# Patient Record
Sex: Female | Born: 1965
Health system: Southern US, Community
[De-identification: ages and names within clinical notes are randomized; demographics above are authoritative.]

## PROBLEM LIST (undated history)

## (undated) DIAGNOSIS — R1115 Cyclical vomiting syndrome unrelated to migraine: Secondary | ICD-10-CM

## (undated) DIAGNOSIS — A630 Anogenital (venereal) warts: Secondary | ICD-10-CM

## (undated) DIAGNOSIS — E119 Type 2 diabetes mellitus without complications: Secondary | ICD-10-CM

## (undated) DIAGNOSIS — E049 Nontoxic goiter, unspecified: Secondary | ICD-10-CM

## (undated) DIAGNOSIS — J309 Allergic rhinitis, unspecified: Secondary | ICD-10-CM

## (undated) DIAGNOSIS — Z86010 Personal history of colonic polyps: Secondary | ICD-10-CM

## (undated) DIAGNOSIS — E785 Hyperlipidemia, unspecified: Secondary | ICD-10-CM

## (undated) DIAGNOSIS — I10 Essential (primary) hypertension: Secondary | ICD-10-CM

## (undated) DIAGNOSIS — D259 Leiomyoma of uterus, unspecified: Secondary | ICD-10-CM

## (undated) DIAGNOSIS — K30 Functional dyspepsia: Secondary | ICD-10-CM

## (undated) DIAGNOSIS — B009 Herpesviral infection, unspecified: Secondary | ICD-10-CM

## (undated) DIAGNOSIS — N900 Mild vulvar dysplasia: Secondary | ICD-10-CM

## (undated) DIAGNOSIS — M1990 Unspecified osteoarthritis, unspecified site: Secondary | ICD-10-CM

## (undated) DIAGNOSIS — G5600 Carpal tunnel syndrome, unspecified upper limb: Secondary | ICD-10-CM

## (undated) HISTORY — DX: Herpesviral infection, unspecified: B00.9

## (undated) HISTORY — DX: Functional dyspepsia: K30

## (undated) HISTORY — DX: Cyclical vomiting syndrome unrelated to migraine: R11.15

## (undated) HISTORY — DX: Anogenital (venereal) warts: A63.0

## (undated) HISTORY — DX: Allergic rhinitis, unspecified: J30.9

## (undated) HISTORY — PX: COLOSTOMY: SHX63

## (undated) HISTORY — DX: Unspecified osteoarthritis, unspecified site: M19.90

## (undated) HISTORY — DX: Hyperlipidemia, unspecified: E78.5

## (undated) HISTORY — DX: Carpal tunnel syndrome, unspecified upper limb: G56.00

## (undated) HISTORY — DX: Type 2 diabetes mellitus without complications: E11.9

## (undated) HISTORY — DX: Mild vulvar dysplasia: N90.0

## (undated) HISTORY — DX: Personal history of colonic polyps: Z86.010

## (undated) HISTORY — DX: Essential (primary) hypertension: I10

## (undated) HISTORY — DX: Nontoxic goiter, unspecified: E04.9

## (undated) HISTORY — PX: CYSTOSCOPY TUMOR / CONDYLOMATA W/ LASER: SUR373

## (undated) HISTORY — DX: Leiomyoma of uterus, unspecified: D25.9

---

## 2000-02-08 ENCOUNTER — Other Ambulatory Visit: Admission: RE | Admit: 2000-02-08 | Discharge: 2000-02-08 | Payer: Self-pay | Admitting: Obstetrics & Gynecology

## 2001-02-11 ENCOUNTER — Other Ambulatory Visit: Admission: RE | Admit: 2001-02-11 | Discharge: 2001-02-11 | Payer: Self-pay | Admitting: Obstetrics & Gynecology

## 2002-12-28 ENCOUNTER — Ambulatory Visit (HOSPITAL_COMMUNITY): Admission: RE | Admit: 2002-12-28 | Discharge: 2002-12-28 | Payer: Self-pay | Admitting: Obstetrics and Gynecology

## 2002-12-28 ENCOUNTER — Encounter: Payer: Self-pay | Admitting: Obstetrics and Gynecology

## 2003-12-13 ENCOUNTER — Other Ambulatory Visit: Admission: RE | Admit: 2003-12-13 | Discharge: 2003-12-13 | Payer: Self-pay | Admitting: Obstetrics and Gynecology

## 2005-04-18 ENCOUNTER — Other Ambulatory Visit: Admission: RE | Admit: 2005-04-18 | Discharge: 2005-04-18 | Payer: Self-pay | Admitting: Obstetrics and Gynecology

## 2006-03-27 ENCOUNTER — Ambulatory Visit (HOSPITAL_COMMUNITY): Admission: RE | Admit: 2006-03-27 | Discharge: 2006-03-27 | Payer: Self-pay | Admitting: Obstetrics and Gynecology

## 2006-08-06 ENCOUNTER — Emergency Department (HOSPITAL_COMMUNITY): Admission: EM | Admit: 2006-08-06 | Discharge: 2006-08-06 | Payer: Self-pay | Admitting: Family Medicine

## 2006-11-07 ENCOUNTER — Other Ambulatory Visit: Admission: RE | Admit: 2006-11-07 | Discharge: 2006-11-07 | Payer: Self-pay | Admitting: Obstetrics and Gynecology

## 2007-03-10 ENCOUNTER — Other Ambulatory Visit: Admission: RE | Admit: 2007-03-10 | Discharge: 2007-03-10 | Payer: Self-pay | Admitting: Obstetrics and Gynecology

## 2007-08-07 ENCOUNTER — Encounter: Admission: RE | Admit: 2007-08-07 | Discharge: 2007-08-07 | Payer: Self-pay | Admitting: Internal Medicine

## 2007-08-07 ENCOUNTER — Encounter: Payer: Self-pay | Admitting: Internal Medicine

## 2007-10-30 HISTORY — PX: UPPER GASTROINTESTINAL ENDOSCOPY: SHX188

## 2007-12-24 ENCOUNTER — Other Ambulatory Visit: Admission: RE | Admit: 2007-12-24 | Discharge: 2007-12-24 | Payer: Self-pay | Admitting: Obstetrics and Gynecology

## 2008-01-07 ENCOUNTER — Encounter: Admission: RE | Admit: 2008-01-07 | Discharge: 2008-01-07 | Payer: Self-pay | Admitting: Obstetrics and Gynecology

## 2008-01-13 ENCOUNTER — Ambulatory Visit: Payer: Self-pay | Admitting: Internal Medicine

## 2008-01-16 ENCOUNTER — Ambulatory Visit: Payer: Self-pay | Admitting: Internal Medicine

## 2008-01-22 ENCOUNTER — Ambulatory Visit (HOSPITAL_COMMUNITY): Admission: RE | Admit: 2008-01-22 | Discharge: 2008-01-22 | Payer: Self-pay | Admitting: Internal Medicine

## 2008-02-04 ENCOUNTER — Ambulatory Visit (HOSPITAL_COMMUNITY): Admission: RE | Admit: 2008-02-04 | Discharge: 2008-02-04 | Payer: Self-pay | Admitting: Internal Medicine

## 2008-07-29 DIAGNOSIS — R1115 Cyclical vomiting syndrome unrelated to migraine: Secondary | ICD-10-CM

## 2008-07-29 HISTORY — DX: Cyclical vomiting syndrome unrelated to migraine: R11.15

## 2008-08-11 ENCOUNTER — Other Ambulatory Visit: Admission: RE | Admit: 2008-08-11 | Discharge: 2008-08-11 | Payer: Self-pay | Admitting: Obstetrics and Gynecology

## 2008-10-23 IMAGING — US US ABDOMEN COMPLETE
1 series · 14 of 25 positions shown · non-contrast
Comparison: none

CLINICAL DATA: Nausea.  
 COMPLETE ABDOMINAL ULTRASOUND:
TECHNIQUE: Complete abdominal ultrasound examination was performed including evaluation of the liver, gallbladder, bile ducts, pancreas, kidneys, spleen, IVC, and abdominal aorta.

[Series 1: unknown · 0.33mm/px · 14 of 81 slices shown]
[im 1/81]
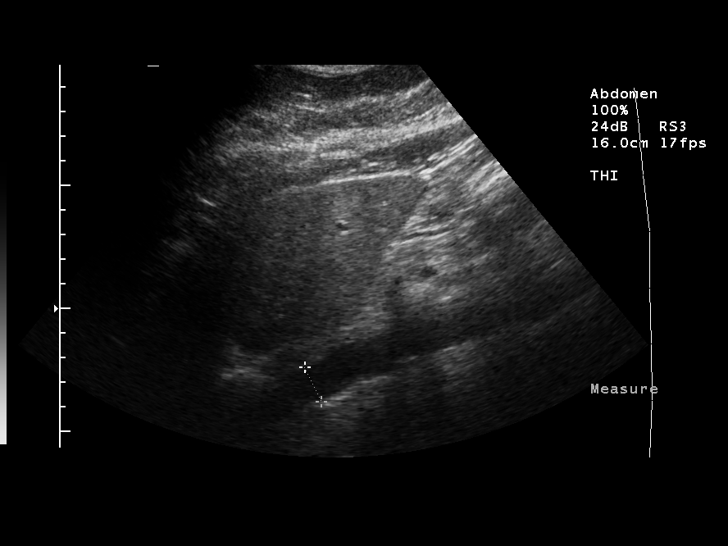
[im 7/81]
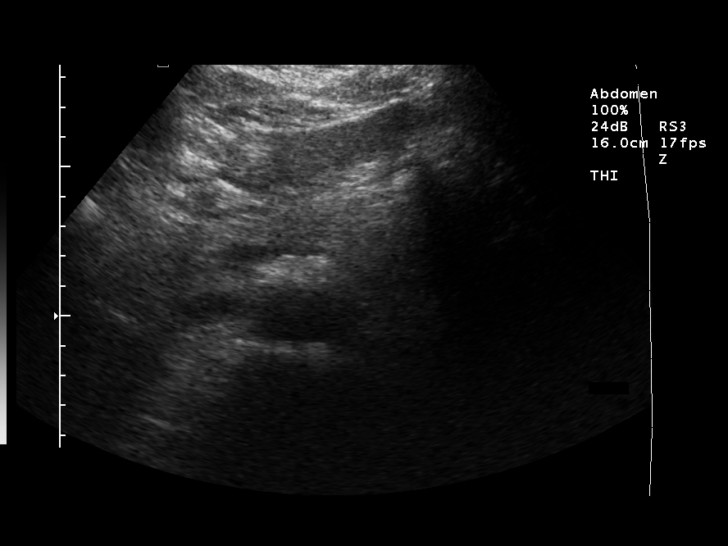
[im 14/81]
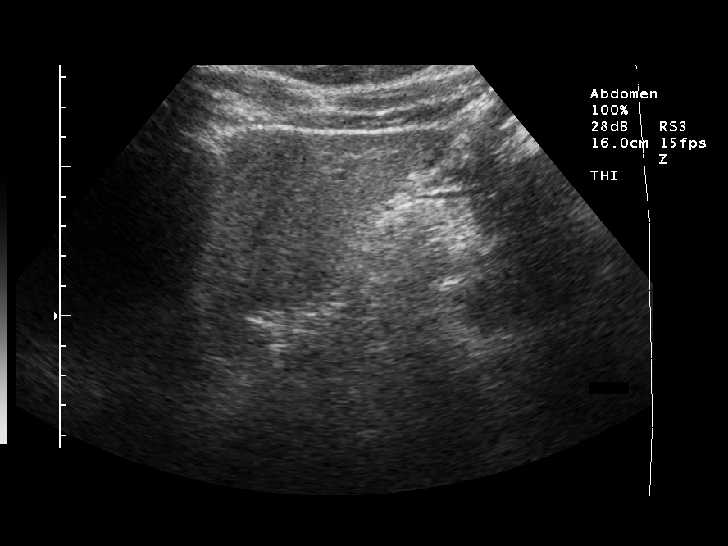
[im 21/81]
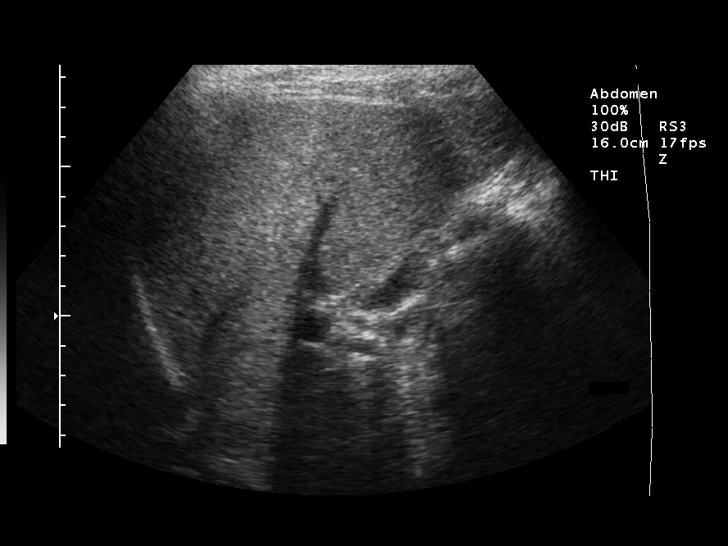
[im 27/81]
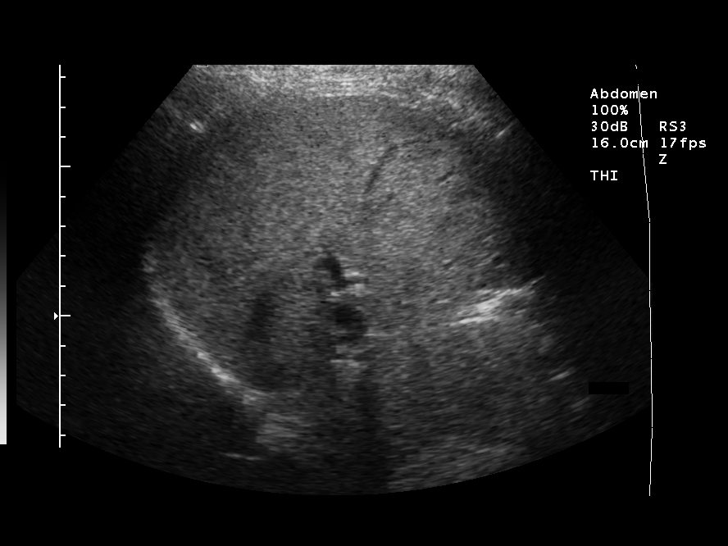
[im 31/81]
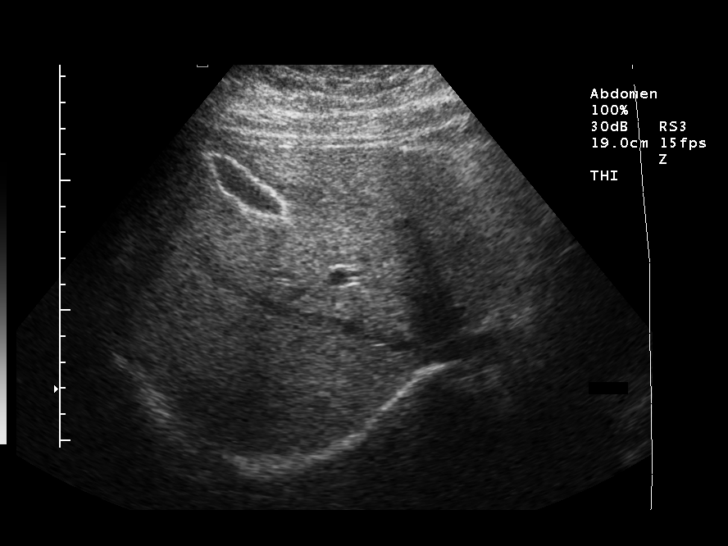
[im 37/81]
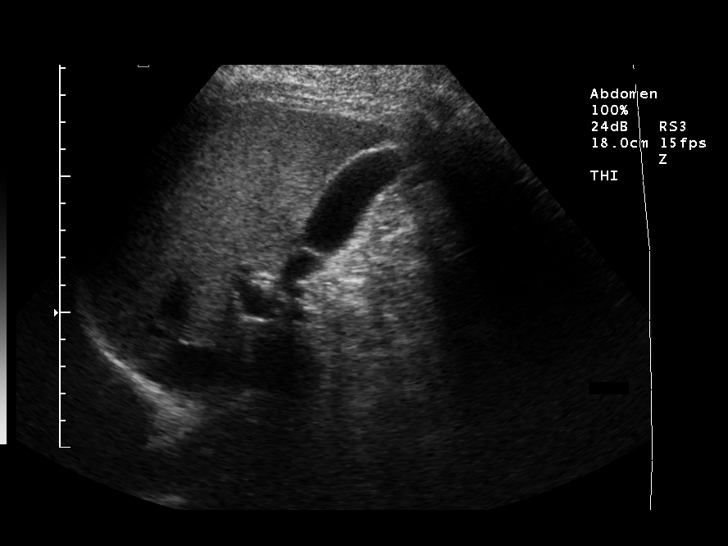
[im 44/81]
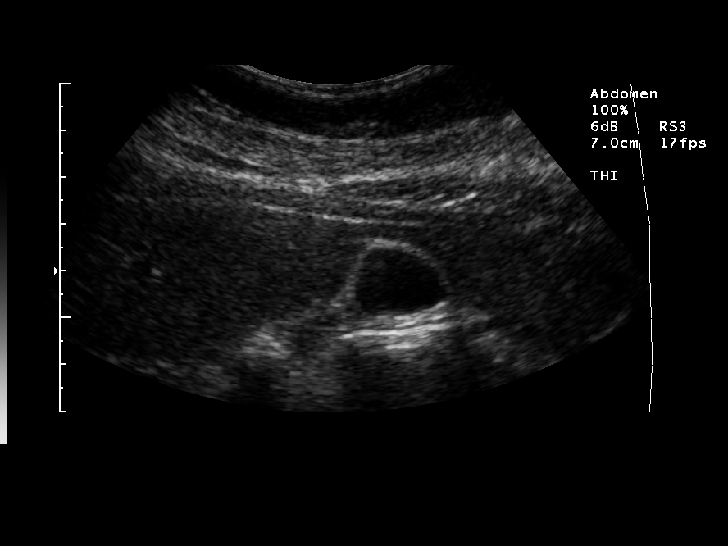
[im 51/81]
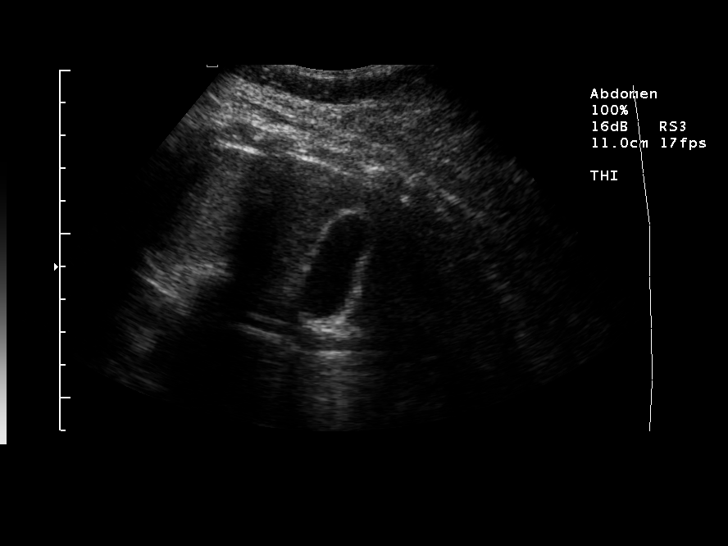
[im 54/81]
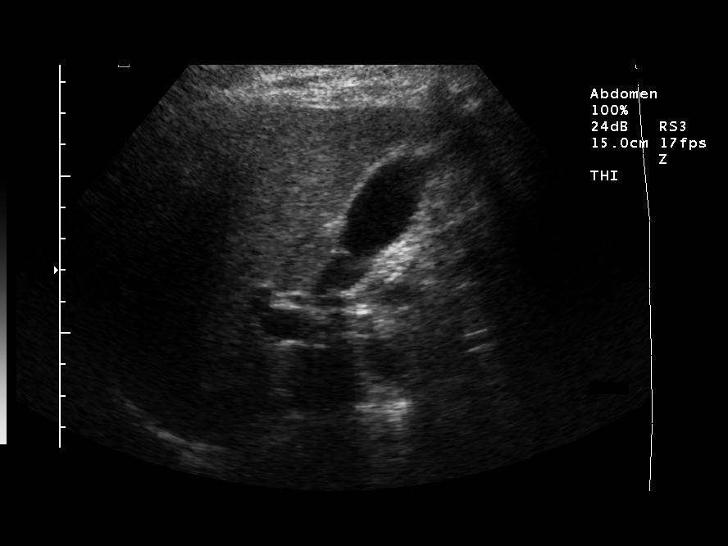
[im 61/81]
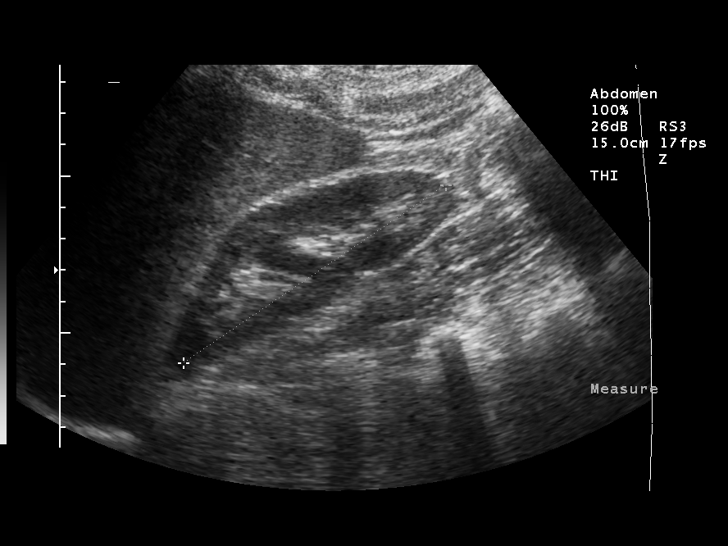
[im 67/81]
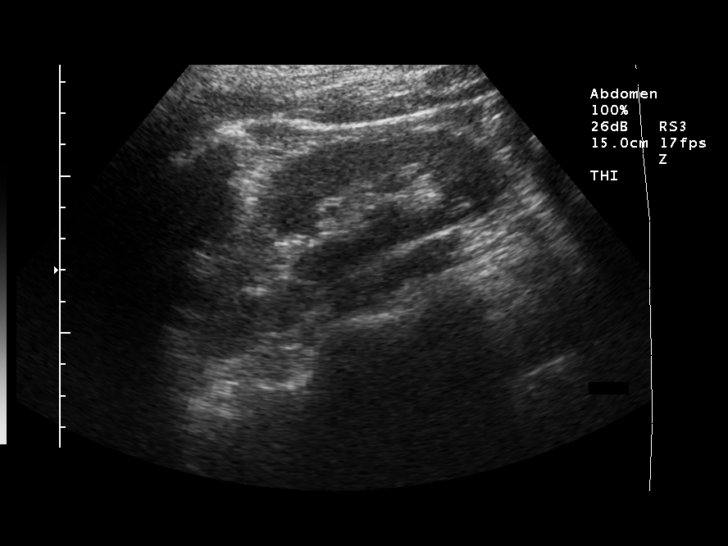
[im 74/81]
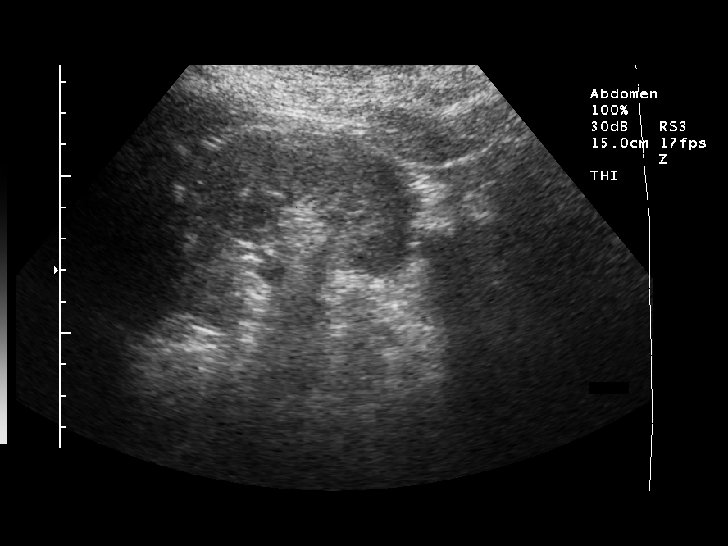
[im 81/81]
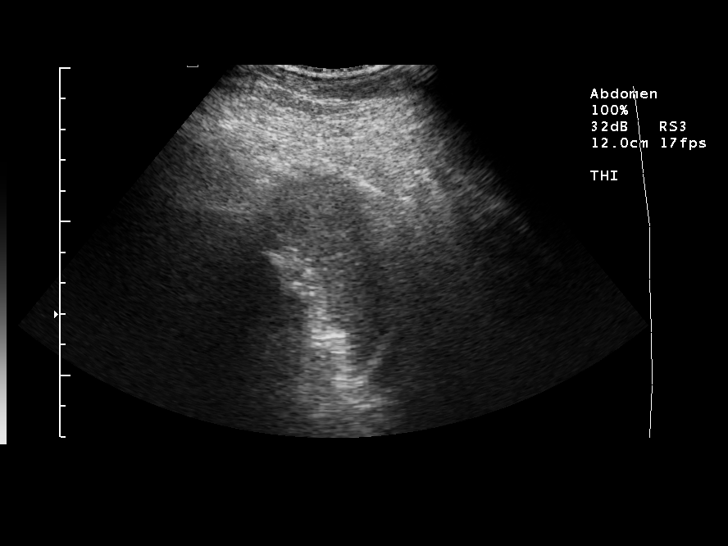

[14 of 25 positions shown; findings below may reference images not displayed]

FINDINGS: There is no evidence of gallstones or biliary ductal dilatation.  The liver is within normal limits in echogenicity, and no focal liver lesions are seen.  The visualized portions of the IVC and pancreas are unremarkable.
 There is no evidence of splenomegaly.  The kidneys are unremarkable, and there is no evidence of hydronephrosis.  The abdominal aorta is non-dilated.  Right renal length 10.1cm and left renal length 9.8cm.
IMPRESSION: Negative abdominal ultrasound.

## 2008-10-29 DIAGNOSIS — K30 Functional dyspepsia: Secondary | ICD-10-CM

## 2008-10-29 HISTORY — DX: Functional dyspepsia: K30

## 2008-10-29 HISTORY — PX: EUS: SHX5427

## 2009-01-14 ENCOUNTER — Telehealth: Payer: Self-pay | Admitting: Internal Medicine

## 2009-01-14 ENCOUNTER — Ambulatory Visit: Payer: Self-pay | Admitting: Internal Medicine

## 2009-01-19 ENCOUNTER — Ambulatory Visit (HOSPITAL_COMMUNITY): Admission: RE | Admit: 2009-01-19 | Discharge: 2009-01-19 | Payer: Self-pay | Admitting: Internal Medicine

## 2009-01-20 ENCOUNTER — Encounter: Payer: Self-pay | Admitting: Gastroenterology

## 2009-02-01 ENCOUNTER — Other Ambulatory Visit: Admission: RE | Admit: 2009-02-01 | Discharge: 2009-02-01 | Payer: Self-pay | Admitting: Obstetrics and Gynecology

## 2009-02-10 ENCOUNTER — Ambulatory Visit (HOSPITAL_COMMUNITY): Admission: RE | Admit: 2009-02-10 | Discharge: 2009-02-10 | Payer: Self-pay | Admitting: Gastroenterology

## 2009-02-10 ENCOUNTER — Ambulatory Visit: Payer: Self-pay | Admitting: Gastroenterology

## 2009-03-25 IMAGING — US US SOFT TISSUE HEAD/NECK
1 series · 14 of 25 positions shown · non-contrast
Comparison: None.

CLINICAL DATA: Thyromegaly by physical exam.
 THYROID ULTRASOUND:
TECHNIQUE: Ultrasound examination of the thyroid gland and adjacent soft tissue structures was performed.

[Series 1: unknown · 0.10mm/px · 14 of 38 slices shown]
[im 1/38]
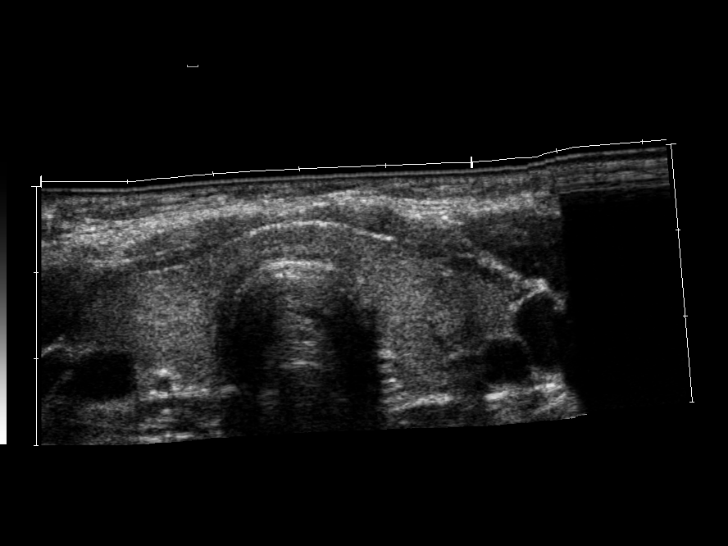
[im 4/38]
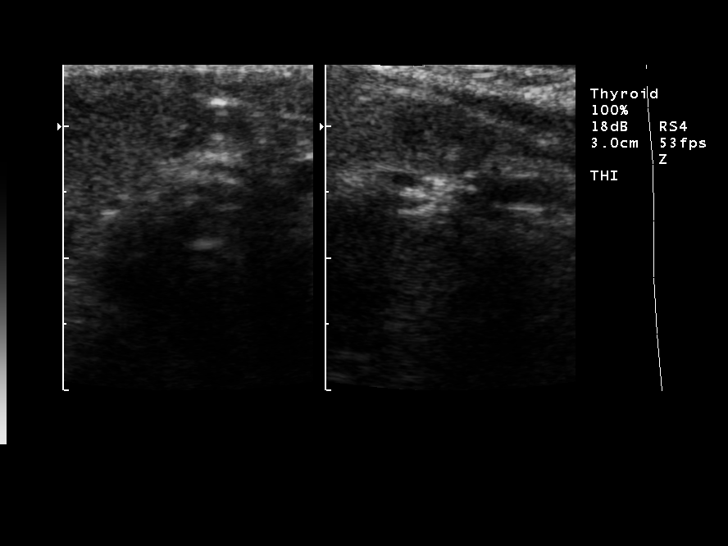
[im 7/38]
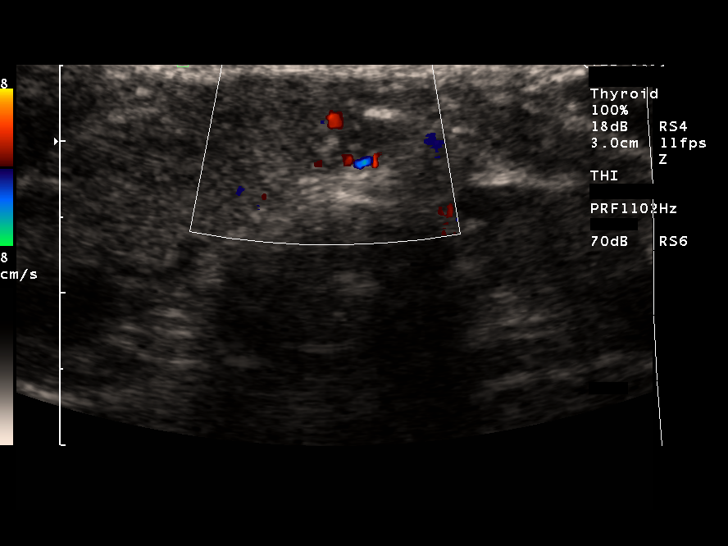
[im 10/38]
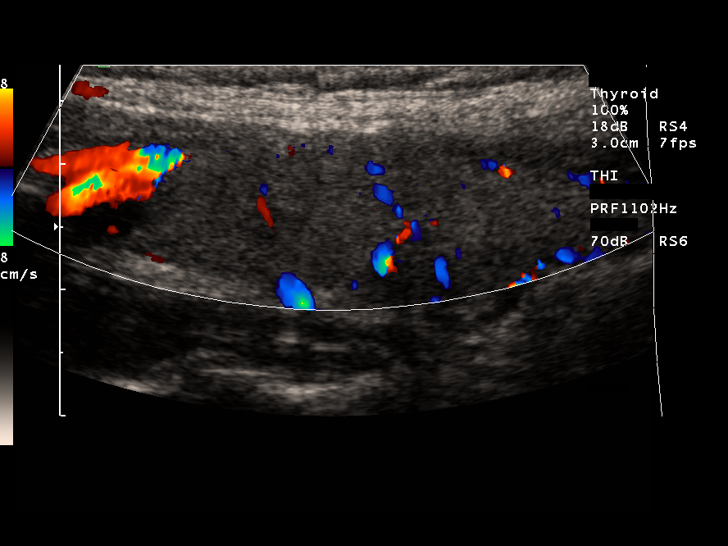
[im 13/38]
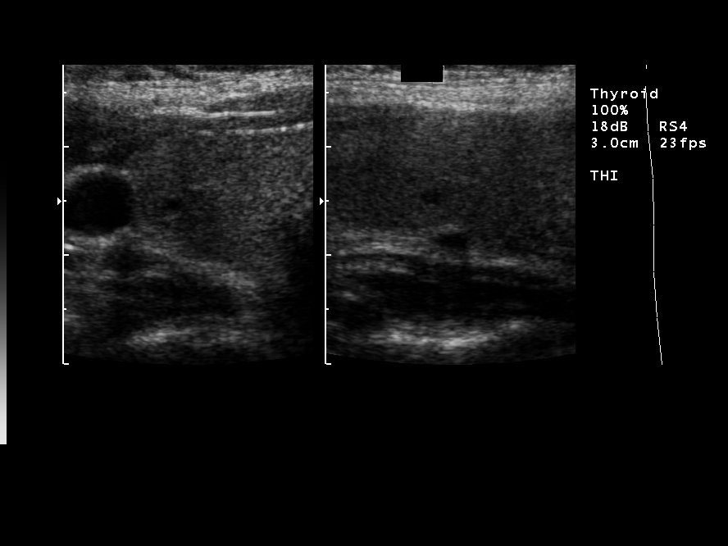
[im 14/38]
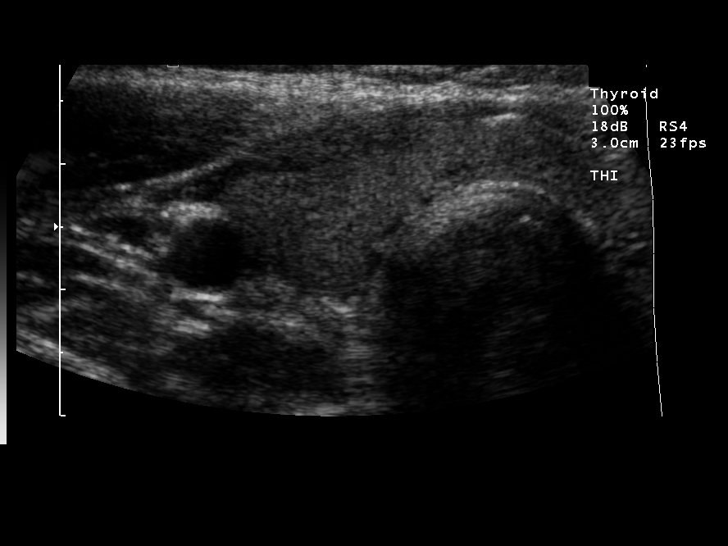
[im 17/38]
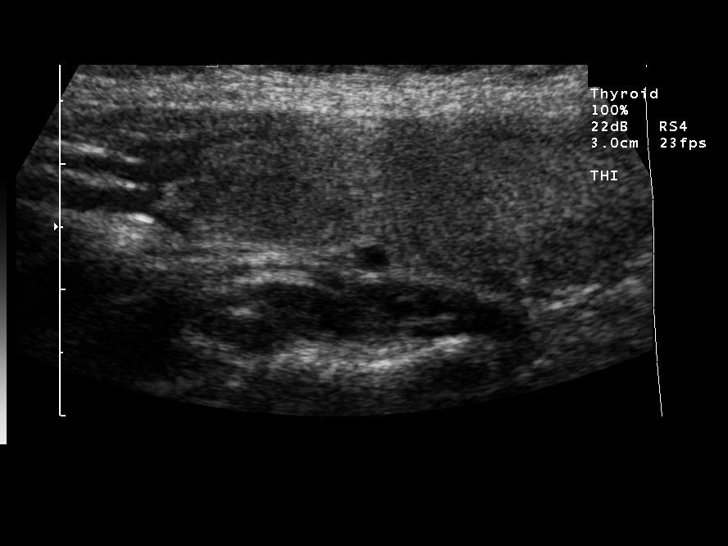
[im 21/38]
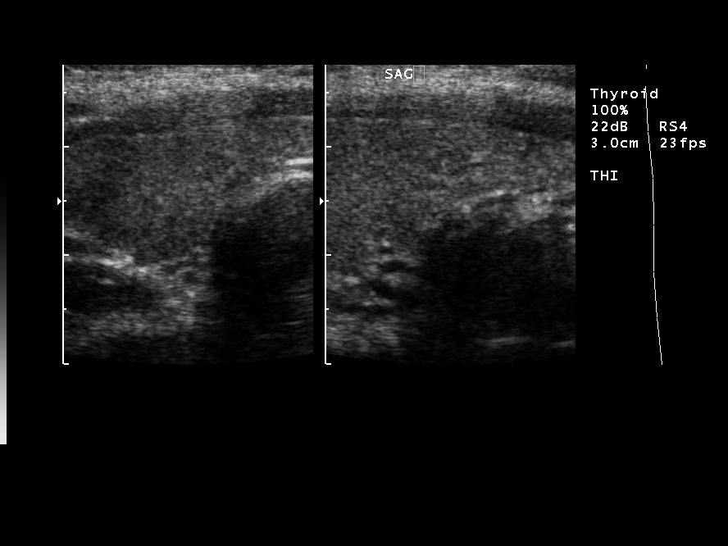
[im 24/38]
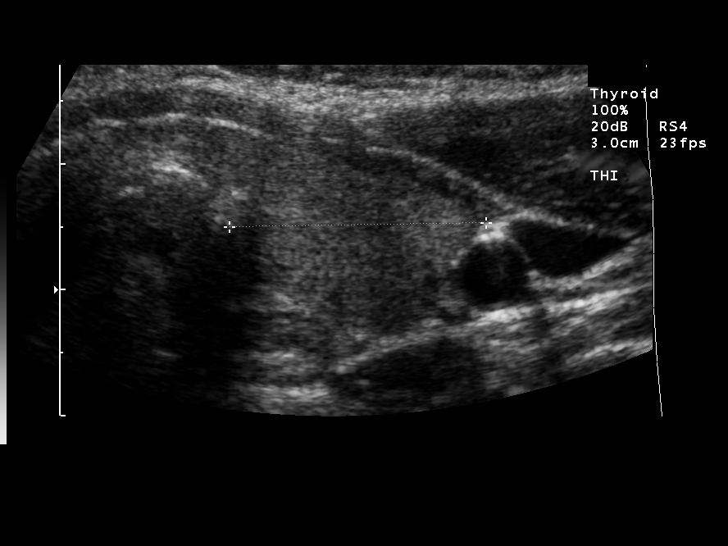
[im 25/38]
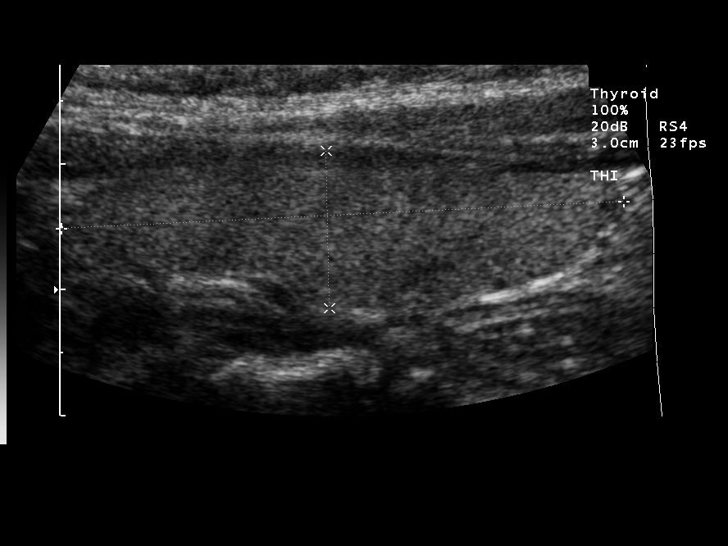
[im 28/38]
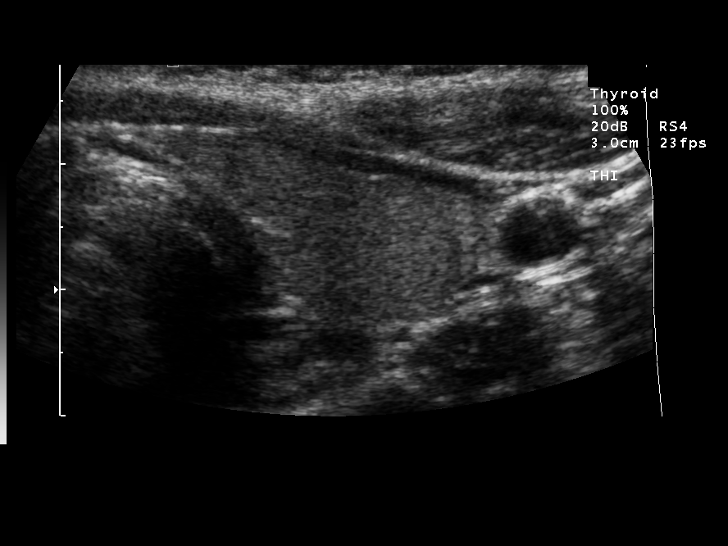
[im 31/38]
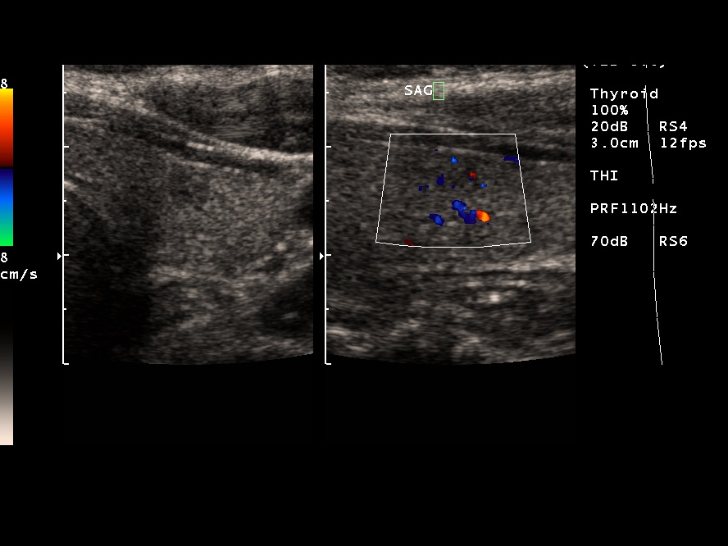
[im 34/38]
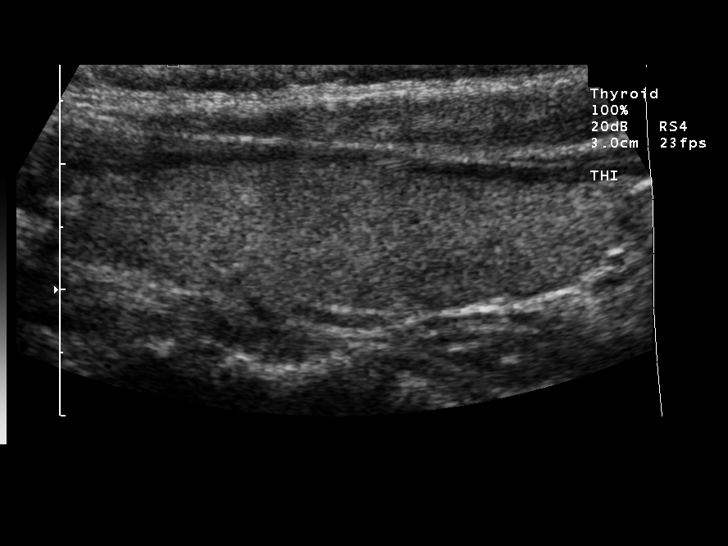
[im 38/38]
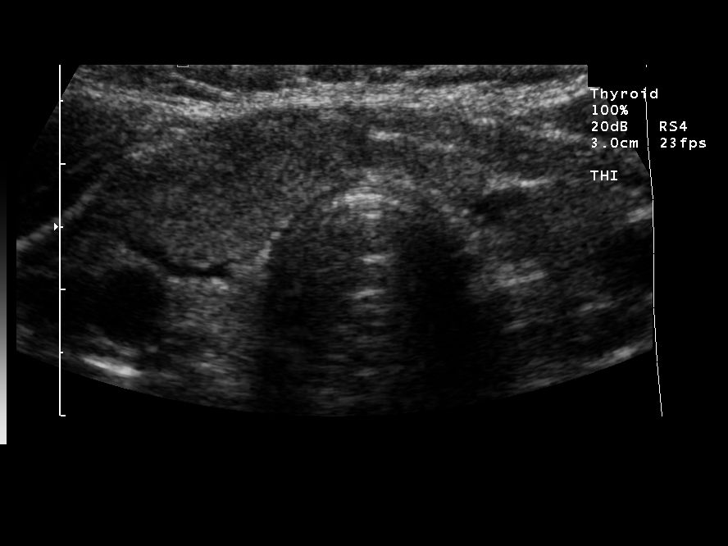

[14 of 25 positions shown; findings below may reference images not displayed]

FINDINGS: The right lobe of the thyroid gland measures 4.6 x 1.3 x 1.7 cm.  The left lobe measures 4.5 x 1.3 x 2.0 cm.  Isthmus measures 4.8 mm.  The thyroid echotexture is homogeneous with multiple bilateral nodules.  Within the mid pole region of the left lobe, there is a solid nodule measuring approximately 5 mm.   There are two solid nodules within the midpole of the right lobe of the thyroid gland.  The largest and more medial of these measures 8.5 mm.  Within the periphery of the midpole of the right kidney, there is a hypoechoic nodule measuring 3.6 x 1.8 x 2.4 mm.
IMPRESSION: 1.  Multiple small bilateral thyroid nodules.  None of these have features specific for malignancy and follow-up examination in one year is suggested.

## 2009-04-04 ENCOUNTER — Ambulatory Visit: Payer: Self-pay | Admitting: Internal Medicine

## 2009-04-06 ENCOUNTER — Encounter: Admission: RE | Admit: 2009-04-06 | Discharge: 2009-04-06 | Payer: Self-pay | Admitting: Endocrinology

## 2009-08-03 ENCOUNTER — Other Ambulatory Visit: Admission: RE | Admit: 2009-08-03 | Discharge: 2009-08-03 | Payer: Self-pay | Admitting: Obstetrics and Gynecology

## 2010-02-01 ENCOUNTER — Other Ambulatory Visit: Admission: RE | Admit: 2010-02-01 | Discharge: 2010-02-01 | Payer: Self-pay | Admitting: Obstetrics and Gynecology

## 2010-04-07 IMAGING — US US ABDOMEN COMPLETE
1 series · 14 of 25 positions shown · non-contrast
Comparison: Nuclear medicine biliary study of 02/04/2008.
Ultrasound 08/07/2007.

CLINICAL DATA: Evaluate epigastric pain.  Nausea and vomiting.

COMPLETE ABDOMINAL ULTRASOUND

[Series 1: us abdomen complete · 0.30mm/px · 14 of 75 slices shown]
[im 1/75]
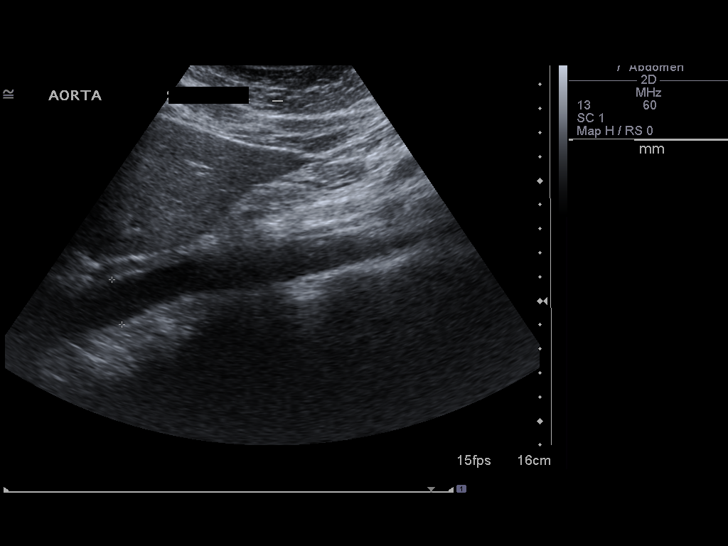
[im 7/75]
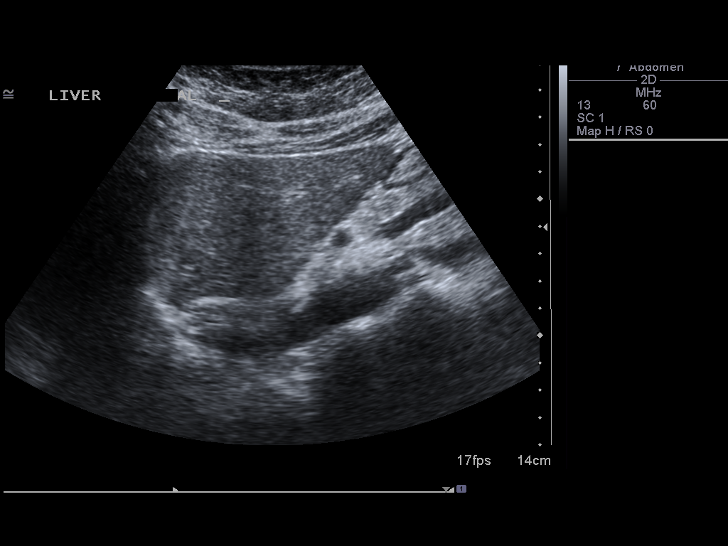
[im 13/75]
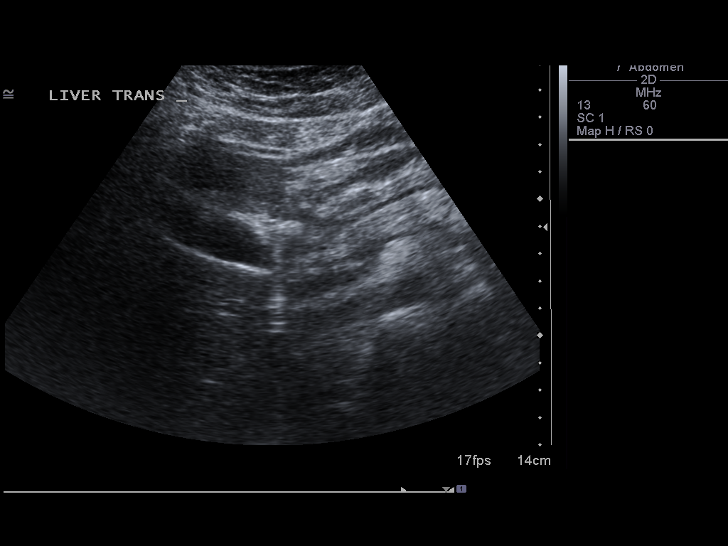
[im 19/75]
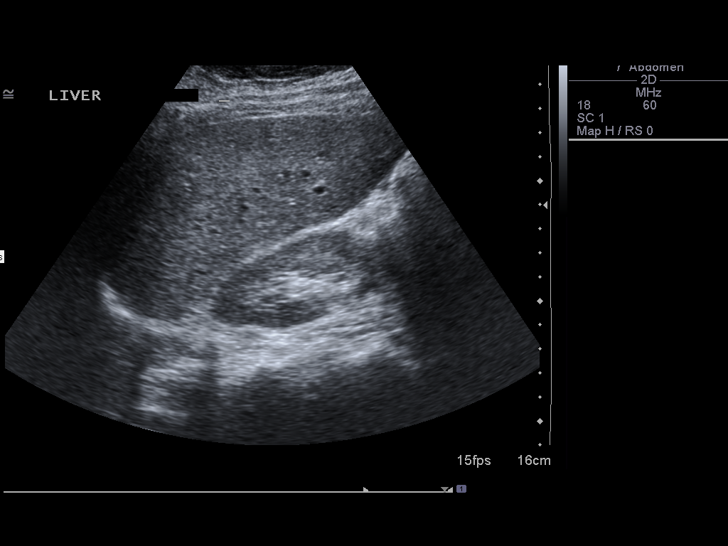
[im 25/75]
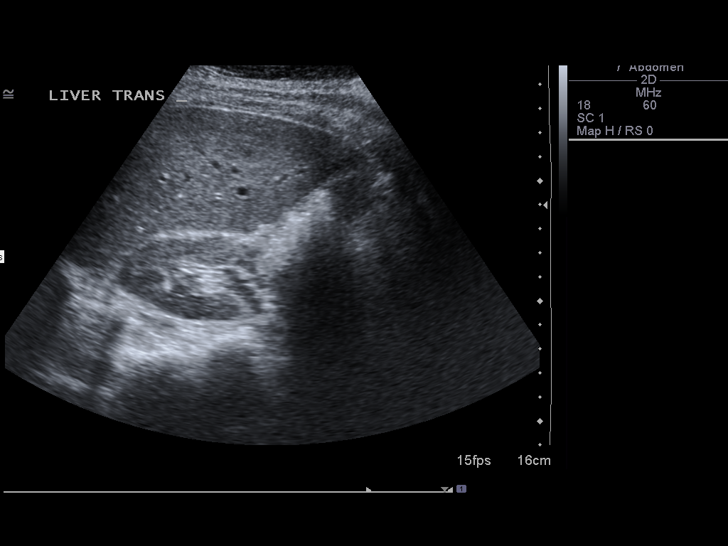
[im 28/75]
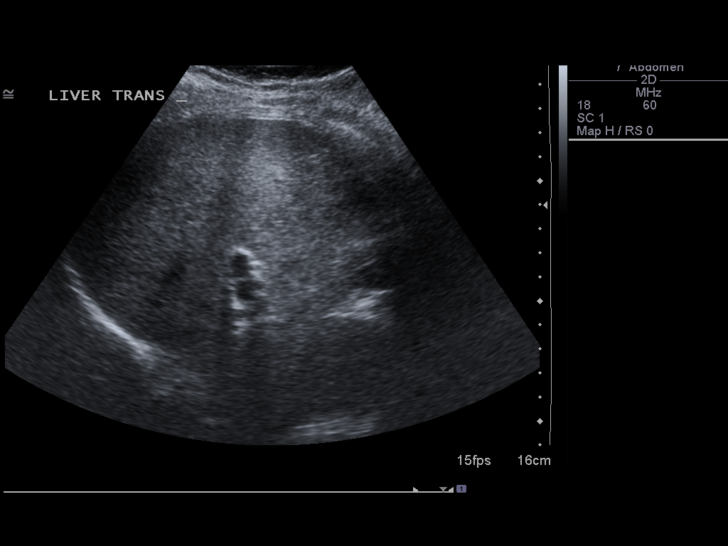
[im 34/75]
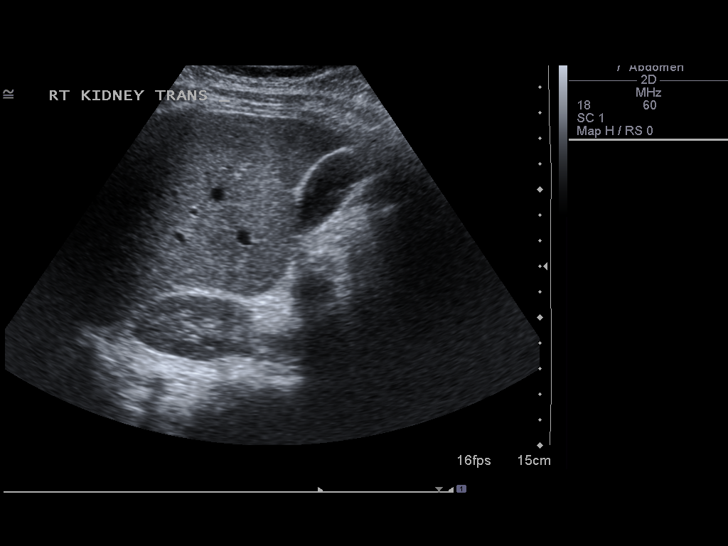
[im 41/75]
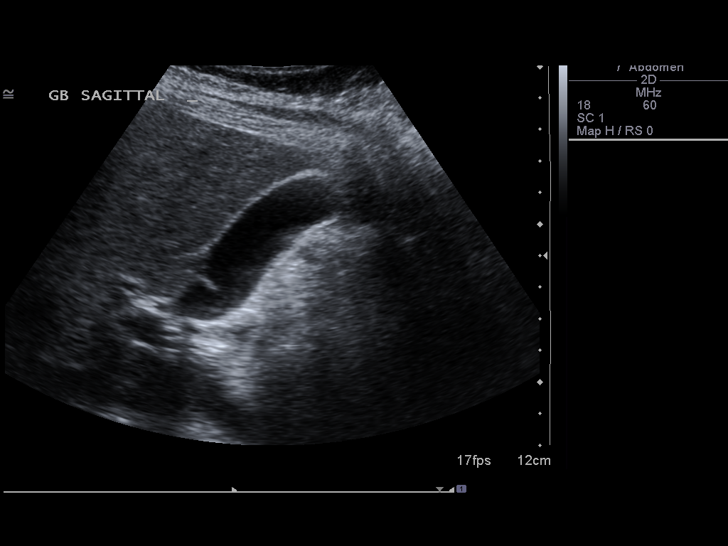
[im 47/75]
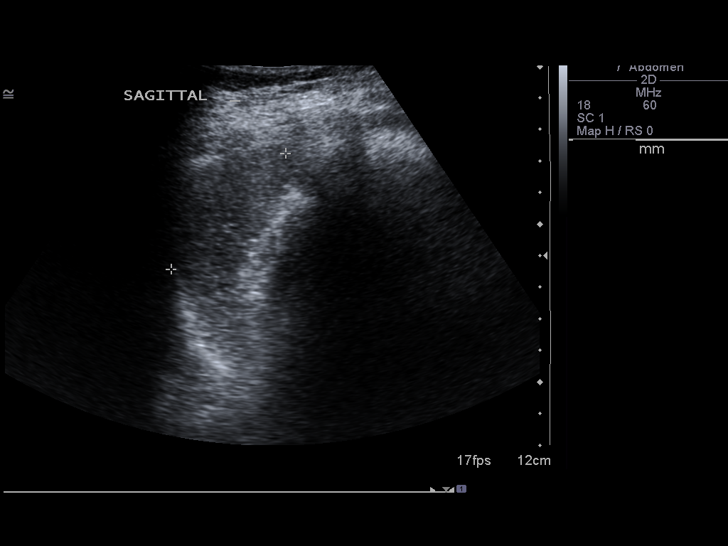
[im 50/75]
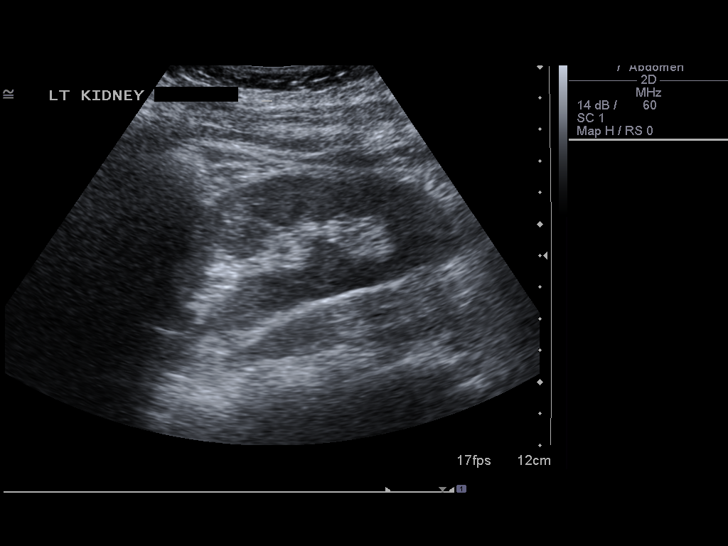
[im 56/75]
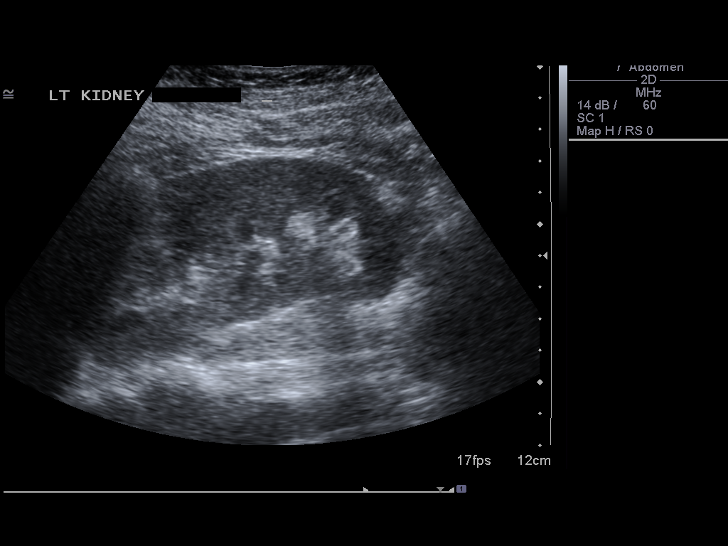
[im 62/75]
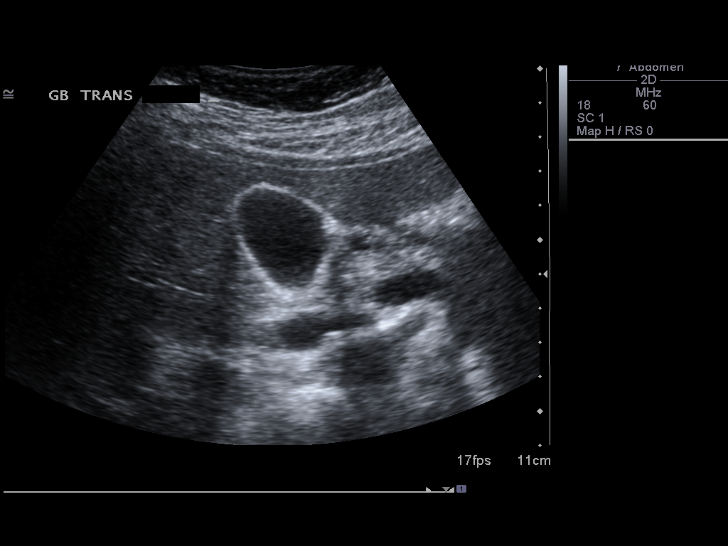
[im 68/75]
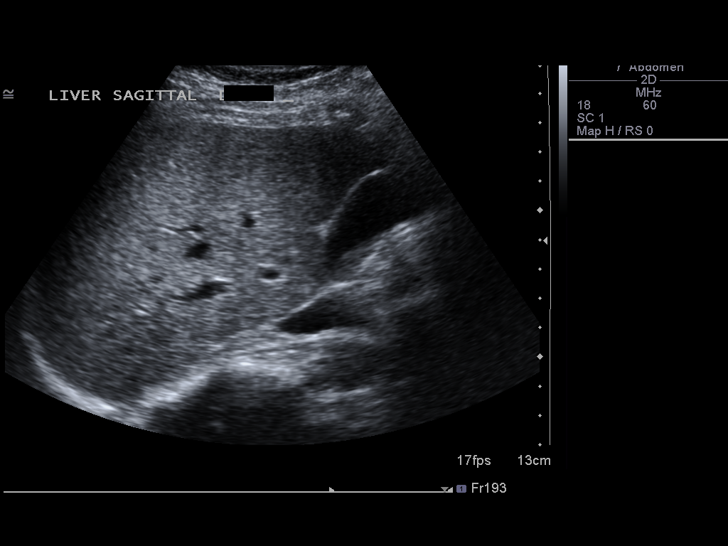
[im 75/75]
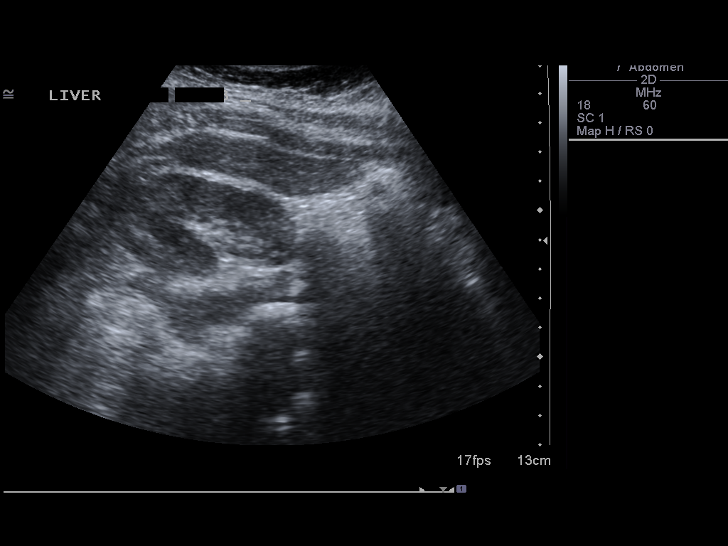

[14 of 25 positions shown; findings below may reference images not displayed]

FINDINGS: Gallbladder:  Normal, without wall thickening, stone, or
pericholecystic fluid.  Sonographic Murphy's sign was not elicited.

Common bile duct:  Normal, at 3 mm.

Liver:  Areas of mild heterogeneous echogenicity including on
images 18 and 21.  This may be artifactual or represent areas of
focal fat deposition.

IVC:  Within normal limits.

Pancreas:  Within normal limits.

Spleen:  Normal in size and echogenecity.

Right Kidney:  10.6 cm

Left Kidney:  10.3 cm.  No hydronephrosis.

Abdominal aorta:  Nonaneurysmal without ascites.
IMPRESSION: 1.  No acute process or explanation for abdominal pain.
2.  Question mild heterogeneous fatty infiltration of the liver
versus artifactual increased echogenicity.

## 2010-08-03 ENCOUNTER — Other Ambulatory Visit: Admission: RE | Admit: 2010-08-03 | Discharge: 2010-08-03 | Payer: Self-pay | Admitting: Obstetrics and Gynecology

## 2010-10-29 DIAGNOSIS — N900 Mild vulvar dysplasia: Secondary | ICD-10-CM

## 2010-10-29 HISTORY — DX: Mild vulvar dysplasia: N90.0

## 2011-03-13 NOTE — Assessment & Plan Note (Signed)
[facility]                         GASTROENTEROLOGY OFFICE NOTE   NAME:[name], [name]                        MRN:          [id number]  DATE:01/13/2008                            DOB:          1966/05/24    CHIEF COMPLAINT:  Vomiting.   REQUESTING PHYSICIAN:  Gwen Pounds, MD   ASSESSMENT:  A 45 year old African American woman that has had a  two to three year history of intermittent vomiting, mainly nocturnal.  precipitated by epigastric pain.  She is somewhat better, but not  relieved on Prilosec.  The symptoms and problems seem to have started  after what was thought to be a gastroenteritis several years ago.  That  does raise the possibility of viral inducted gastroparesis.  Gastroesophageal reflux disease and peptic ulcer disease are also in the  differential.   PLAN:  Schedule upper GI endoscopy to investigate.  Further plans  pending that.  Risks, benefits and indications are explained.  She  understands and agrees to proceed.  She has already had an abdominal  ultrasound that was negative in October 2008.   HISTORY:  As above, this 41-uear-old African American woman relates a  history of problems with vomiting.  She generally gets a relatively  intense epigastric pain and then has nausea and vomiting often at night.  She tends to roll over in the bed and have this.  She specifically  indicates that she has forceful vomiting and not regurgitation.  There  is some intermittent heartburn.  In January or February she self -  started Prilosec OTC and does feel better, but is not completely  relieved.  Bowel habits are a little bit on the constipated side, she  said, but that is no significant change and she generally moves her  bowels well without any bleeding or problems.  She has lost 7 pounds  over a period of time that is not quite known to me.  Overall no major  weight loss reported however.  I think that weight loss was over the  past few months.   CURRENT MEDICATIONS:  1. Prilosec OTC daily.  2. Simvastatin 20 mg daily.  3. Acyclovir 400 mg daily.  4. Lisinopril 40 mg daily.  5. Microgestin 1/20 daily.  6. Aspirin 81 mg daily.  7. Fish Oil 1000 mg daily.  8. Calcium with vitamin D 600 mg daily.  9. Multivitamin daily.  10.B100 Complex daily.   ALLERGIES:  TETRACYCLINE CAUSES HIVES.   PAST MEDICAL HISTORY:  1. Dyslipidemia.  2. Hypertension.  3. Recent thyroid ultrasound, unclear reasons.  Question nodule.  4. Allergic rhinitis.  5. Previous heart rhythm disturbance reported.  6. Genital warts.  7. HSV.   No surgeries.   FAMILY HISTORY:  Father had heart disease, history of alcoholism.  Parents have diabetes.  The patient herself has been borderline.   SOCIAL HISTORY:  She is single.  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  She does not use tobacco, alcohol or drugs.  She has minimal, if  any, caffeine use.   REVIEW OF SYSTEMS:  Positive for some allergies, insomnia.  She does  have sinus headache problems, but these do not seem to be correlated  significantly with her abdominal symptoms.  Sore throat, urinary  incontinence, visual changes and night sweats are reported as well.  All  other systems are negative.   PHYSICAL EXAMINATION:  Reveals a well developed, well nourished middle-  aged black woman.  Height 5 feet, weight 143 pounds.  Blood pressure  122/70, pulse 88.  EYES:  Anicteric.  Pupils round and reactive to light.  MOUTH:  Posterior pharynx free of lesions.  Dentition in good repair.  NECK:  Supple without thyromegaly or mass.  CHEST:  Clear to my exam.  HEART:  S1, S2.  No murmurs, rubs or gallops.  ABDOMEN:  Soft, nontender.  No succussion splash.  Bowel sounds are  present.  There is no organomegaly or mass.  LOWER EXTREMITIES:  Free of edema.  SKIN:  Warm and dry.  No rash.  No groin, supraclavicular or cervical adenopathy.  She is alert and oriented x3.  Cranial  nerves II-XII intact.  Mood and  affect are appropriate.   I appreciate the opportunity to care for this patient.     Iva Boop, MD,FACG  Electronically Signed    CEG/MedQ  DD: 01/13/2008  DT: 01/13/2008  Job #: [id number]

## 2011-03-16 NOTE — H&P (Signed)
[name] [name], [name] [name]               ACCOUNT NO.:  [id number]   MEDICAL RECORD NO.:  [id number]          PATIENT TYPE:  AMB   LOCATION:  [facility]                           FACILITY:  [facility]   PHYSICIAN:  Artist Pais, M.D.    DATE OF BIRTH:  1965-12-23   DATE OF ADMISSION:  DATE OF DISCHARGE:                                HISTORY & PHYSICAL   HISTORY OF PRESENT ILLNESS:  The patient is a 45 year old, gravida 0,  African-American female who presented in February for her annual GYN  examination. At that time, she was found to have multiple frond-like areas  all over her vulva and in the perianal region. The working diagnosis was  that of condylomata, and in fact, she returned for biopsies which showed  condylomata acuminatum. On followup evaluation, she was found to have a new  condyloma at the left labia. This was biopsied and found to be associated  with a low-grade vulvar intra-epithelial neoplasia. The patient was advised  to undergo laser treatment because of the very confluent nature of these  condylomata. She was advised as to other treatment for condylomata, but I  did explain to her that it would be difficult, in fact, to remove these via  some type of therapy such as podophyllin, TCA or Aldara. She was made aware  that my preference would be to do laser surgery. Risks of laser including  anesthetic complication, hemorrhage, infection, damage to adjacent  structures including bladder, bowel, blood vessels, ureters were discussed  with the patient, and she was made aware of the risk of retinal damage as  well. She expressed understanding of and acceptance of these risks and  desires to proceed with surgery. She was checked for other sexually  transmitted diseases, and GC, chlamydia, HIV and RPR were negative. Also  explained to her that given that these were so confluent it was likely that  her boyfriend has other sexual partners, and she was urged to discuss this  with him. I also did  a wet prep looking for Trichomonas and this was  negative as well.   OBSTETRICAL/GYNECOLOGICAL HISTORY:  The patient uses Microgestin for  contraception. Her cycles are every 28 days with a 3- to 5-day duration of  flow. She does not have a history of any other sexually transmitted disease  with the exception of these condylomata. No history of PID. No history of  abnormal Pap.   PAST MEDICAL HISTORY:  1.  Hypertension.  2.  Fibroid uterus and menorrhagia, well controlled with Microgestin oral      contraceptive.   ALLERGIES:  TETRACYCLINE causes hives.   CURRENT MEDICATIONS:  1.  Tarka 180 mg daily.  2.  Microgestin for contraception.   PAST SURGICAL HISTORY:  None.   FAMILY HISTORY:  There is no family history of colon, breast, ovarian, or  prostate cancer. Her father is 89 with hypertension and heart disease. Her  mother is 38 with hypertension and diabetes. Her brother is 23 with  hypertension. She does not have any children.   SOCIAL HISTORY:  The patient does not smoke.  She also does not drink. She  works for [employer] in housekeeping.   REVIEW OF SYSTEMS:  Noncontributory except as noted above. Denies headaches,  visual changes, chest pain, shortness of breath, abdominal pain, change in  bowel habits, unintentional weight loss, dysuria, urgency, frequency,  vaginal pruritus or discharge, pain or burning with intercourse.   PHYSICAL EXAMINATION:  GENERAL APPEARANCE:  Well-developed, African-American  female.  VITAL SIGNS:  Blood pressure 150/84, heart rate 104, weight 147.  HEENT:  Normal.  NECK:  Supple without thyromegaly, adenopathy or nodules.  CHEST:  Clear to auscultation.  BREASTS:  Symmetric without masses. No nipple retraction or nipple  discharge.  CARDIAC:  Regular rate and rhythm without extra sounds or murmurs.  ABDOMEN:  Soft and nontender. No hepatosplenomegaly or masses.  EXTREMITIES:  No clubbing, cyanosis, or edema.  NEUROLOGICAL:   Oriented x3. Grossly normal.  PELVIC:  Normal external female genitalia. The patient has confluent  condylomata all over her perineal and perianal regions. She also has  condylomatous areas of the labia minora and also at the introitus as well.  Bimanual examination reveals her uterus to be six weeks, mobile and  nontender without any adnexal mass palpated. There is no pelvic tenderness.  RECTAL:  Examination reveals excellent sphincter tone. No masses palpated.   IMPRESSION AND PLAN:  The patient is a 45 year old African-American female  with multiple condylomata all over her perineal and perianal regions. She  has also been found to have VIN1. She has a flat, white area of the right  labia minora edge which was found to be VIN1. She also has multiple vaginal  condylomata as well. She is admitted for laser to the vulva and vagina as  well as biopsy of the vulva and vagina. Risks have been explained to her.  She was given a prescription for Tylox as well as Silvadene cream. She also  knows that she will need to soak in Instant Ocean several times daily. She  will call for any problems.           ______________________________  Artist Pais, M.D.     DC/MEDQ  D:  03/26/2006  T:  03/26/2006  Job:  [id number]

## 2011-03-16 NOTE — Op Note (Signed)
NAMEAFTIN, LYE               ACCOUNT NO.:  [id number]   MEDICAL RECORD NO.:  [id number]          PATIENT TYPE:  AMB   LOCATION:  [facility]                           FACILITY:  [facility]   PHYSICIAN:  Artist Pais, M.D.    DATE OF BIRTH:  February 08, 1966   DATE OF PROCEDURE:  03/27/2006  DATE OF DISCHARGE:                                 OPERATIVE REPORT   PREOPERATIVE DIAGNOSIS:  Multiple vulvar, perianal and vaginal condylomata.   POSTOPERATIVE DIAGNOSIS:  Multiple vulvar, perianal and vaginal condylomata.   PROCEDURE:  Laser of vulvar and perianal condylomata and laser of vaginal  condylomata.   SURGEON:  Artist Pais, M.D.   ASSISTANT:  None.   ESTIMATED BLOOD LOSS:  Minimal.   ANESTHESIA:  General endotracheal.   FLUIDS:  1000 mL of crystalloid.   COMPLICATIONS:  None.   DESCRIPTION OF OPERATION:  The patient was brought to the operating room and  identified on the operating room table.  After induction of adequate general  endotracheal anesthesia, the patient was placed in a dorsal lithotomy  position and draped with wet towels.  An ice pack was placed over the vulva  and placed for five minutes to decrease the spread of the burn from the  laser.  Prior to this time the entire vulva was coated with acetic acid  solution.  Subsequently, using the Surgilase laser with the most focused  beam of 10 watts, the multiple areas of condylomata were lasered.  This was  done very carefully to decrease the risk of any third degree burn.  I  initially lasered the multiple condylomata but did not take them all the way  down to the base of the skin, choosing instead to do this under microscopic  guidance subsequently.  I was very careful to make sure that all condylomata  were lasered.  It should be noted that there are multiple condylomata on the  perineum, at the vaginal introitus.  There also condylomata of the right  labia minora and also at the right inner labia majora, which had not  been  previously present but appeared to be a normal-appearing condyloma.  Great  care was taken to keep a soaked gauze in the area of the rectum to decrease  the risk of any gas affecting the laser beam.  Subsequently the microscope  was attached to the laser and the remainder of the condylomata were then  lasered down to the bases.  It should be noted that just with the pin  portion of the laser, I lasered them to approximately one-half of their  depth and then converted to the microscope so that I could very readily  identify the borders under microscopic guidance of the condylomata, taking  care to decrease any laser burn of any of the surrounding skin, just to  laser the condylomata.  This was a very tedious procedure due to the  multiple discrete condylomata.  The discrete nature of these multiple  condylomata did not lend themselves to the normal paintbrush-type stroke  used for a vulvar laser but rather multiple small, discrete firings  of the  laser to decrease the risk of any adjacent tissue lasering.  Subsequently I  examined all areas which had contained condylomata and all condylomata were  noted to be completely evacuated with the laser.  The procedure proceeded  under microscopic guidance to ensure no depth of penetration of the laser  beam below the pink adventitial layer.  At that point after examining the  areas of condylomata, again there were noted to be no residual condylomata.  There was noted to be only a small amount of oozing of some blood but very  minimal oozing.  Subsequently Silvadene cream was placed.  We did not have  to do any further biopsies.  At that point the procedure was then terminated  and the patient was transferred to the recovery room in stable condition  after all instrument, sponge and needle counts were correct.  It should be  noted that prior to the beginning of the surgery, a time-out was performed  in accordance with policy.   Subsequently I  wrote out all of the patient's postoperative instructions,  but I had reviewed them with her extensively in the office.  She is to use  one cup of Instant Ocean in her bathtub and soak at least four times daily,  drying off with the cool setting of the hair dryer.  She was then counseled  to use Silvadene cream four times daily.  She previously had a prescription  for Darvocet-N 100 and was urged to take one or two every 6 hours as needed  for pain.  She was also given a prescription in the office for Tylox to use  per bottle directions if the Darvocet was not efficacious, but she is also  urged to not use these simultaneously.  She can only use one or the other,  and I have directed her regarding this.  In addition, she was instructed to  ensure that she achieves adequate fluid and fiber intake to decrease any  risk of constipation.  After urinating or bowel movements, she is urged to  wipe gently and to then cleanse her skin with some clear water.  The patient  will return to the office in a week for me to examine the operative sites  and to call if any problems.           ______________________________  Artist Pais, M.D.     DC/MEDQ  D:  03/27/2006  T:  03/27/2006  Job:  [id number]

## 2011-08-08 ENCOUNTER — Other Ambulatory Visit: Payer: Self-pay | Admitting: Obstetrics and Gynecology

## 2011-08-08 ENCOUNTER — Other Ambulatory Visit (HOSPITAL_COMMUNITY)
Admission: RE | Admit: 2011-08-08 | Discharge: 2011-08-08 | Disposition: A | Discharge status: Home / Self Care | Payer: Self-pay | Source: Ambulatory Visit | Attending: Obstetrics and Gynecology | Admitting: Obstetrics and Gynecology

## 2011-08-08 DIAGNOSIS — Z01419 Encounter for gynecological examination (general) (routine) without abnormal findings: Secondary | ICD-10-CM | POA: Insufficient documentation

## 2012-07-17 ENCOUNTER — Encounter: Payer: Self-pay | Admitting: Internal Medicine

## 2012-07-21 ENCOUNTER — Telehealth: Payer: Self-pay | Admitting: Internal Medicine

## 2012-07-21 NOTE — Telephone Encounter (Signed)
pt states received recall letter for colon, pt said she will not go through with it and not to send another letter

## 2012-08-26 ENCOUNTER — Other Ambulatory Visit: Payer: Self-pay | Admitting: Obstetrics and Gynecology

## 2012-08-26 ENCOUNTER — Other Ambulatory Visit (HOSPITAL_COMMUNITY)
Admission: RE | Admit: 2012-08-26 | Discharge: 2012-08-26 | Disposition: A | Discharge status: Home / Self Care | Payer: 59 | Source: Ambulatory Visit | Attending: Obstetrics and Gynecology | Admitting: Obstetrics and Gynecology

## 2012-08-26 DIAGNOSIS — Z01419 Encounter for gynecological examination (general) (routine) without abnormal findings: Secondary | ICD-10-CM | POA: Insufficient documentation

## 2013-08-27 ENCOUNTER — Other Ambulatory Visit (HOSPITAL_COMMUNITY)
Admission: RE | Admit: 2013-08-27 | Discharge: 2013-08-27 | Disposition: A | Discharge status: Home / Self Care | Payer: 59 | Source: Ambulatory Visit | Attending: Obstetrics and Gynecology | Admitting: Obstetrics and Gynecology

## 2013-08-27 ENCOUNTER — Other Ambulatory Visit: Payer: Self-pay | Admitting: Obstetrics and Gynecology

## 2013-08-27 DIAGNOSIS — Z01419 Encounter for gynecological examination (general) (routine) without abnormal findings: Secondary | ICD-10-CM | POA: Insufficient documentation

## 2013-08-27 DIAGNOSIS — Z1151 Encounter for screening for human papillomavirus (HPV): Secondary | ICD-10-CM | POA: Insufficient documentation

## 2013-09-14 ENCOUNTER — Encounter: Payer: Self-pay | Admitting: Internal Medicine

## 2013-10-13 ENCOUNTER — Encounter: Payer: Self-pay | Admitting: Internal Medicine

## 2013-10-13 ENCOUNTER — Ambulatory Visit (INDEPENDENT_AMBULATORY_CARE_PROVIDER_SITE_OTHER): Payer: 59 | Admitting: Internal Medicine

## 2013-10-13 VITALS — BP 138/70 | HR 100 | Ht 60.0 in | Wt 147.2 lb

## 2013-10-13 DIAGNOSIS — R195 Other fecal abnormalities: Secondary | ICD-10-CM

## 2013-10-13 DIAGNOSIS — K5900 Constipation, unspecified: Secondary | ICD-10-CM

## 2013-10-13 DIAGNOSIS — K5909 Other constipation: Secondary | ICD-10-CM

## 2013-10-13 MED ORDER — LINACLOTIDE 145 MCG PO CAPS: 145.0000 ug | ORAL_CAPSULE | Freq: Every day | ORAL | Status: AC

## 2013-10-13 MED ORDER — NA SULFATE-K SULFATE-MG SULF 17.5-3.13-1.6 GM/177ML PO SOLN: ORAL | Status: DC

## 2013-10-13 NOTE — Assessment & Plan Note (Signed)
iFOBT positive stool. Colonoscopy is appropriate to evaluate for neoplasia that could cause this. The risks and benefits as well as alternatives of endoscopic procedure(s) have been discussed and reviewed. All questions answered. The patient agrees to proceed.

## 2013-10-13 NOTE — Assessment & Plan Note (Signed)
Seems like a slow transit problem. Rectal exam shows normal anorectal function. She has failed MiraLax, using stimulant laxatives. It is partially successful in relieving her problems. A sample trial of Linzess with a prescription given, 145 mcg daily. Side effects discussed.

## 2013-10-13 NOTE — Patient Instructions (Signed)
You have been scheduled for a colonoscopy with propofol. Please follow written instructions given to you at your visit today.  Please pick up your prep kit at the pharmacy within the next 1-3 days. If you use inhalers (even only as needed), please bring them with you on the day of your procedure. Your physician has requested that you go to www.startemmi.com and enter the access code given to you at your visit today. This web site gives a general overview about your procedure. However, you should still follow specific instructions given to you by our office regarding your preparation for the procedure.  Today you have been given samples of Linzess , take one 30 minutes prior to your first meal of the day.  If the samples work we are providing you with a written rx to take to the pharmacy.   I appreciate the opportunity to care for you.

## 2013-10-13 NOTE — Progress Notes (Signed)
Subjective:  Referred by: Creola Corn M.D.   Patient ID: [name] [name], female    DOB: 1966-06-04, 47 y.o.   MRN: [id number]  HPI Patient is a very pleasant 47 year old African American woman known to me from previous GI problems of persistent vomiting, these have resolved. As part of routine evaluation recently she had an immune fecal occult blood test that was positive. She does not report any bleeding. She has had problems with constipation for many years and says she does not move her bowels and she doesn't correct all. She does that about twice a week. In general she does not strain or have difficulty producing a bowel movement when that occurs though she has some difficulty. She gets bloated and uncomfortable in between defecation. Her GI review of systems is also positive for rare heartburn at this time but is otherwise negative. Allergies  Allergen Reactions  . Tetracycline     REACTION: hives   Outpatient Prescriptions Prior to Visit  Medication Sig Dispense Refill  . acyclovir (ZOVIRAX) 200 MG capsule Take 200 mg by mouth daily.      Marland Kitchen amLODipine (NORVASC) 5 MG tablet Take 5 mg by mouth daily.      Marland Kitchen aspirin 81 MG tablet Take 81 mg by mouth daily.      . Norethin Ace-Eth Estrad-FE (MICROGESTIN FE 1/20 PO) Take 1 tablet by mouth daily.      . Omega-3 Fatty Acids (OMEGA 3 PO) Take 1 tablet by mouth 2 (two) times daily.      . simvastatin (ZOCOR) 20 MG tablet Take 20 mg by mouth daily.       No facility-administered medications prior to visit.   Past Medical History  Diagnosis Date  . Dyslipidemia   . Hypertension   . Allergic rhinitis   . Condylomata acuminata   . HSV (herpes simplex virus) infection   . Uterine fibroid   . Vulvar intraepithelial neoplasia I 2012  . CTS (carpal tunnel syndrome)     bilateral  . Goiter     throid  . OA (osteoarthritis)     Bilateral ankles  . Persistent vomiting 2009-10   Past Surgical History  Procedure Laterality Date  . Eus     . Cystoscopy tumor / condylomata w/ laser    . Upper gastrointestinal endoscopy     History   Social History  . Marital Status: Single         Number of Children: 0  .     Occupational History  . [employer]  also works as a Copy    Social History Main Topics  . Smoking status: Never Smoker   . Smokeless tobacco: Never Used  . Alcohol Use: No  . 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 Diabetes Mother     type 2  . Hypertension Mother   . Hypothyroidism Mother   . Osteoporosis Mother   . Diabetes Father     type 2  . Heart disease Father   . Hypertension Father   . Gout Father   . Congestive Heart Failure Father   . CVA Father   . Hypertension Brother   . Diabetes Sister   . Breast cancer Paternal Aunt   . Colon cancer  Paternal Aunt    Review of Systems This is positive for those things mentiones in the HPI, also positive for allergies, ankle pain from osteoarthritis, insomnia, some night sweats and fatigue.. All other review of systems are negative.      Objective:   Physical Exam General:  Well-developed, well-nourished and in no acute distress Eyes:  anicteric. ENT:   Mouth and posterior pharynx free of lesions.  Neck:   supple w/o thyromegaly or mass.  Lungs: Clear to auscultation bilaterally. Heart:  S1S2, no rubs, murmurs, gallops. Abdomen:  soft, non-tender, no hepatosplenomegaly, hernia, or mass and BS+.  Rectal:  Female chaperone present Anoderm inspection revealed a tiny anal tag at 3:00 left lateral decubitus Anal wink was that positive Digital exam revealed normal resting tone and voluntary squeeze. No mass or rectocele present. Simulated defecation with valsalva revealed appropriate abdominal contraction and descent.    Lymph:  no cervical or supraclavicular  adenopathy. Extremities:   no edema Skin   no rash. Neuro:  A&O x 3.  Psych:  appropriate mood and  Affect.   Data Reviewed: HEENT no sure positive stool at Dr. Ferd Hibbs office. December 11 primary care note showing recent acute sinusitis, she is on her last day of antibiotics for that. 09/14/2013 PCP note. Normal CBC 08/28/2013. Hemoglobin 14.2 MCV 95. Comprehensive metabolic panel normal except for glucose 132. Hemoglobin A1c 5.8%. TSH and free T4 normal at 1.738 0.9 respectively.    Assessment & Plan:   1. Heme + stool   2. Chronic constipation    I appreciate the opportunity to care for this patient. CC: Gwen Pounds, MD

## 2013-10-15 ENCOUNTER — Encounter: Payer: Self-pay | Admitting: Internal Medicine

## 2013-11-12 ENCOUNTER — Ambulatory Visit (AMBULATORY_SURGERY_CENTER): Payer: 59 | Admitting: Internal Medicine

## 2013-11-12 ENCOUNTER — Encounter: Payer: Self-pay | Admitting: Internal Medicine

## 2013-11-12 VITALS — BP 124/81 | HR 79 | Temp 98.9°F | Resp 16 | Ht 60.0 in | Wt 147.0 lb

## 2013-11-12 DIAGNOSIS — R195 Other fecal abnormalities: Secondary | ICD-10-CM

## 2013-11-12 DIAGNOSIS — Z860100 Personal history of colon polyps, unspecified: Secondary | ICD-10-CM | POA: Insufficient documentation

## 2013-11-12 DIAGNOSIS — D126 Benign neoplasm of colon, unspecified: Secondary | ICD-10-CM

## 2013-11-12 HISTORY — DX: Personal history of colonic polyps: Z86.010

## 2013-11-12 HISTORY — DX: Personal history of colon polyps, unspecified: Z86.0100

## 2013-11-12 MED ORDER — SODIUM CHLORIDE 0.9 % IV SOLN: 500.0000 mL | INTRAVENOUS | Status: DC

## 2013-11-12 NOTE — Progress Notes (Signed)
Called to room to assist during endoscopic procedure.  Patient ID and intended procedure confirmed with present staff. Received instructions for my participation in the procedure from the performing physician.  

## 2013-11-12 NOTE — Op Note (Signed)
[place]  [address] [place], [address]   COLONOSCOPY PROCEDURE REPORT  PATIENT: [name] [name], [name] [name]  MR#: [id number] BIRTHDATE: 12-14-1965 , 27  yrs. old GENDER: Female ENDOSCOPIST: Gatha Mayer, MD, [facility] PROCEDURE DATE:  11/12/2013 PROCEDURE:   Colonoscopy with biopsy First Screening Colonoscopy - Avg.  risk and is 50 yrs.  old or older - No.  Prior Negative Screening - Now for repeat screening. N/A  History of Adenoma - Now for follow-up colonoscopy & has been > or = to 3 yrs.  N/A  Polyps Removed Today? Yes. ASA CLASS:   Class II INDICATIONS:heme-positive stool. MEDICATIONS: Propofol (Diprivan) 280 mg IV, MAC sedation, administered by CRNA, and These medications were titrated to patient response per physician's verbal order  DESCRIPTION OF PROCEDURE:   After the risks benefits and alternatives of the procedure were thoroughly explained, informed consent was obtained.  A digital rectal exam revealed no abnormalities of the rectum.   The [id number]  endoscope was introduced through the anus and advanced to the cecum, which was identified by both the appendix and ileocecal valve. No adverse events experienced.   The quality of the prep was excellent using Suprep  The instrument was then slowly withdrawn as the colon was fully examined.      COLON FINDINGS: A diminutive sessile polyp was found in the ascending colon.  A polypectomy was performed with cold forceps. The resection was complete and the polyp tissue was completely retrieved.   The colon mucosa was otherwise normal.   A right colon retroflexion was performed.  Retroflexed views revealed no abnormalities. The time to cecum=3 minutes 01 seconds.  Withdrawal time=11 minutes 55 seconds.  The scope was withdrawn and the procedure completed. COMPLICATIONS: There were no complications.  ENDOSCOPIC IMPRESSION: 1.   Diminutive sessile polyp was found in the ascending colon; polypectomy was  performed with cold forceps 2.   Normal colonoscopy otherwise - excellent prep  RECOMMENDATIONS: 1.  Timing of repeat colonoscopy will be determined by pathology findings. 2.   If adenoma would do again in 7-8 years 3.   OK to avoid stop routine hemoccults   eSigned:  Gatha Mayer, MD, [facility] 11/12/2013 2:10 PM   cc: Shon Baton, MD and The Patient

## 2013-11-12 NOTE — Progress Notes (Signed)
Report to pacu rn, vss, bbs=clear 

## 2013-11-12 NOTE — Patient Instructions (Addendum)
I found and removed one tiny polyp.  YOU HAD AN ENDOSCOPIC PROCEDURE TODAY AT [facility]: Refer to the procedure report that was given to you for any specific questions about what was found during the examination.  If the procedure report does not answer your questions, please call your gastroenterologist to clarify.  If you requested that your care partner not be given the details of your procedure findings, then the procedure report has been included in a sealed envelope for you to review at your convenience later.  YOU SHOULD EXPECT: Some feelings of bloating in the abdomen. Passage of more gas than usual.  Walking can help get rid of the air that was put into your GI tract during the procedure and reduce the bloating. If you had a lower endoscopy (such as a colonoscopy or flexible sigmoidoscopy) you may notice spotting of blood in your stool or on the toilet paper. If you underwent a bowel prep for your procedure, then you may not have a normal bowel movement for a few days.  DIET: Your first meal following the procedure should be a light meal and then it is ok to progress to your normal diet.  A half-sandwich or bowl of soup is an example of a good first meal.  Heavy or fried foods are harder to digest and may make you feel nauseous or bloated.  Likewise meals heavy in dairy and vegetables can cause extra gas to form and this can also increase the bloating.  Drink plenty of fluids but you should avoid alcoholic beverages for 24 hours.  ACTIVITY: Your care partner should take you home directly after the procedure.  You should plan to take it easy, moving slowly for the rest of the day.  You can resume normal activity the day after the procedure however you should NOT DRIVE or use heavy machinery for 24 hours (because of the sedation medicines used during the test).    SYMPTOMS TO REPORT IMMEDIATELY: A gastroenterologist can be reached at any hour.  During normal business hours, 8:30  AM to 5:00 PM Monday through Friday, call [phone].  After hours and on weekends, please call the GI answering service at [phone] who will take a message and have the physician on call contact you.   Following lower endoscopy (colonoscopy or flexible sigmoidoscopy):  Excessive amounts of blood in the stool  Significant tenderness or worsening of abdominal pains  Swelling of the abdomen that is new, acute  Fever of 100F or higher  FOLLOW UP: If any biopsies were taken you will be contacted by phone or by letter within the next 1-3 weeks.  Call your gastroenterologist if you have not heard about the biopsies in 3 weeks.  Our staff will call the home number listed on your records the next business day following your procedure to check on you and address any questions or concerns that you may have at that time regarding the information given to you following your procedure. This is a courtesy call and so if there is no answer at the home number and we have not heard from you through the emergency physician on call, we will assume that you have returned to your regular daily activities without incident.  SIGNATURES/CONFIDENTIALITY: You and/or your care partner have signed paperwork which will be entered into your electronic medical record.  These signatures attest to the fact that that the information above on your After Visit Summary has been reviewed and is understood.  Full responsibility of the confidentiality of this discharge information lies with you and/or your care-partner.

## 2013-11-13 ENCOUNTER — Telehealth: Payer: Self-pay | Admitting: *Deleted

## 2013-11-13 NOTE — Telephone Encounter (Signed)
  Follow up Call-  Call back number 11/12/2013  Post procedure Call Back phone  # [phone]  Permission to leave phone message Yes     Patient questions:  Do you have a fever, pain , or abdominal swelling? no Pain Score  0 *  Have you tolerated food without any problems? yes  Have you been able to return to your normal activities? yes  Do you have any questions about your discharge instructions: Diet   no Medications  no Follow up visit  no  Do you have questions or concerns about your Care? no  Actions: * If pain score is 4 or above: No action needed, pain <4.

## 2013-11-18 ENCOUNTER — Encounter: Payer: Self-pay | Admitting: Internal Medicine

## 2013-11-18 NOTE — Progress Notes (Signed)
Quick Note:  Diminutive adenoma - repeat colonoscopy 2022 ______ 

## 2014-08-31 ENCOUNTER — Other Ambulatory Visit: Payer: Self-pay | Admitting: Obstetrics and Gynecology

## 2014-08-31 ENCOUNTER — Other Ambulatory Visit (HOSPITAL_COMMUNITY)
Admission: RE | Admit: 2014-08-31 | Discharge: 2014-08-31 | Disposition: A | Discharge status: Home / Self Care | Payer: 59 | Source: Ambulatory Visit | Attending: Obstetrics and Gynecology | Admitting: Obstetrics and Gynecology

## 2014-08-31 DIAGNOSIS — Z01419 Encounter for gynecological examination (general) (routine) without abnormal findings: Secondary | ICD-10-CM | POA: Insufficient documentation

## 2014-09-02 LAB — CYTOLOGY - PAP

## 2014-09-28 ENCOUNTER — Other Ambulatory Visit: Payer: Self-pay | Admitting: Internal Medicine

## 2014-09-28 DIAGNOSIS — E049 Nontoxic goiter, unspecified: Secondary | ICD-10-CM

## 2014-10-04 ENCOUNTER — Ambulatory Visit
Admission: RE | Admit: 2014-10-04 | Discharge: 2014-10-04 | Disposition: A | Discharge status: Home / Self Care | Payer: 59 | Source: Ambulatory Visit | Attending: Internal Medicine | Admitting: Internal Medicine

## 2014-10-04 DIAGNOSIS — E049 Nontoxic goiter, unspecified: Secondary | ICD-10-CM

## 2015-09-13 ENCOUNTER — Other Ambulatory Visit (HOSPITAL_COMMUNITY)
Admission: RE | Admit: 2015-09-13 | Discharge: 2015-09-13 | Disposition: A | Discharge status: Home / Self Care | Payer: 59 | Source: Ambulatory Visit | Attending: Obstetrics and Gynecology | Admitting: Obstetrics and Gynecology

## 2015-09-13 ENCOUNTER — Other Ambulatory Visit: Payer: Self-pay | Admitting: Obstetrics and Gynecology

## 2015-09-13 DIAGNOSIS — Z01419 Encounter for gynecological examination (general) (routine) without abnormal findings: Secondary | ICD-10-CM | POA: Insufficient documentation

## 2015-09-15 LAB — CYTOLOGY - PAP

## 2015-09-24 ENCOUNTER — Encounter (HOSPITAL_COMMUNITY): Payer: Self-pay | Admitting: Vascular Surgery

## 2015-09-24 ENCOUNTER — Emergency Department (HOSPITAL_COMMUNITY)
Admission: EM | Admit: 2015-09-24 | Discharge: 2015-09-24 | Disposition: A | Discharge status: Home / Self Care | Payer: 59 | Attending: Emergency Medicine | Admitting: Emergency Medicine

## 2015-09-24 DIAGNOSIS — M1990 Unspecified osteoarthritis, unspecified site: Secondary | ICD-10-CM | POA: Insufficient documentation

## 2015-09-24 DIAGNOSIS — Y9389 Activity, other specified: Secondary | ICD-10-CM | POA: Diagnosis not present

## 2015-09-24 DIAGNOSIS — Y9289 Other specified places as the place of occurrence of the external cause: Secondary | ICD-10-CM | POA: Insufficient documentation

## 2015-09-24 DIAGNOSIS — Z8719 Personal history of other diseases of the digestive system: Secondary | ICD-10-CM | POA: Diagnosis not present

## 2015-09-24 DIAGNOSIS — I10 Essential (primary) hypertension: Secondary | ICD-10-CM | POA: Insufficient documentation

## 2015-09-24 DIAGNOSIS — Y998 Other external cause status: Secondary | ICD-10-CM | POA: Diagnosis not present

## 2015-09-24 DIAGNOSIS — W108XXA Fall (on) (from) other stairs and steps, initial encounter: Secondary | ICD-10-CM | POA: Insufficient documentation

## 2015-09-24 DIAGNOSIS — Z8619 Personal history of other infectious and parasitic diseases: Secondary | ICD-10-CM | POA: Diagnosis not present

## 2015-09-24 DIAGNOSIS — Z79899 Other long term (current) drug therapy: Secondary | ICD-10-CM | POA: Diagnosis not present

## 2015-09-24 DIAGNOSIS — Z86018 Personal history of other benign neoplasm: Secondary | ICD-10-CM | POA: Insufficient documentation

## 2015-09-24 DIAGNOSIS — Z7982 Long term (current) use of aspirin: Secondary | ICD-10-CM | POA: Diagnosis not present

## 2015-09-24 DIAGNOSIS — M545 Low back pain: Secondary | ICD-10-CM

## 2015-09-24 DIAGNOSIS — S3992XA Unspecified injury of lower back, initial encounter: Secondary | ICD-10-CM | POA: Insufficient documentation

## 2015-09-24 DIAGNOSIS — Z8669 Personal history of other diseases of the nervous system and sense organs: Secondary | ICD-10-CM | POA: Insufficient documentation

## 2015-09-24 DIAGNOSIS — E785 Hyperlipidemia, unspecified: Secondary | ICD-10-CM | POA: Diagnosis not present

## 2015-09-24 DIAGNOSIS — W19XXXA Unspecified fall, initial encounter: Secondary | ICD-10-CM

## 2015-09-24 MED ORDER — IBUPROFEN 400 MG PO TABS
800.0000 mg | ORAL_TABLET | Freq: Once | ORAL | Status: AC
  Administered 2015-09-24: 800 mg via ORAL
  Filled 2015-09-24: qty 2

## 2015-09-24 MED ORDER — ACETAMINOPHEN 500 MG PO TABS: 500.0000 mg | ORAL_TABLET | Freq: Four times a day (QID) | ORAL | Status: AC | PRN

## 2015-09-24 MED ORDER — IBUPROFEN 800 MG PO TABS: 800.0000 mg | ORAL_TABLET | Freq: Three times a day (TID) | ORAL | Status: AC

## 2015-09-24 MED ORDER — ACETAMINOPHEN 325 MG PO TABS
650.0000 mg | ORAL_TABLET | Freq: Once | ORAL | Status: AC
  Administered 2015-09-24: 650 mg via ORAL
  Filled 2015-09-24: qty 2

## 2015-09-24 NOTE — ED Notes (Signed)
Pt reports to the ED for eval of low back pain following a fall that occurred yesterday. Pt denies any head injury or LOC. Pt localizes the pain to her lower back. Denies any numbness, tingling, paralysis, or bowel or bladder changes. Pt A&Ox4, resp e/u, and skin warm and dry

## 2015-09-24 NOTE — Discharge Instructions (Signed)

## 2015-09-24 NOTE — ED Provider Notes (Signed)
CSN: [id number]     Arrival date & time 09/24/15  2001 History  By signing my name below, I, [name] [name], attest that this documentation has been prepared under the direction and in the presence of Gloriann Loan, PA-C. Electronically Signed: Rayna [name], ED Scribe. 09/24/2015. 8:36 PM.   Chief Complaint  Patient presents with  . Fall  . Back Pain   The history is provided by the patient. No language interpreter was used.    HPI Comments: [name] [name] is a 49 y.o. female who presents to the Emergency Department complaining of a fall that occurred yesterday. Pt notes that she was going down the stairs when she tripped and fell backwards and landed on the stairs. She notes associated, moderate, non-radiating, lower right back pain. Pt notes taking naproxen for pain management with her last dose around 12:00 PM and denies any relief. She notes a hx of DM. She takes Asprin daily. Pt denies head trauma, LOC, CP, SOB, numbness, tingling, incontinence of her bowels or bladder, abd pain, increased urinary frequency and dysuria.   Past Medical History  Diagnosis Date  . Dyslipidemia   . Hypertension   . Allergic rhinitis   . Condylomata acuminata   . HSV (herpes simplex virus) infection   . Uterine fibroid   . Vulvar intraepithelial neoplasia I 2012  . CTS (carpal tunnel syndrome)     bilateral  . Goiter     throid  . OA (osteoarthritis)     Bilateral ankles  . Persistent vomiting 2009-10  . Functional dyspepsia 2010    Normal EGD and EUS  . Personal history of colonic adenoma 11/12/2013    11/12/2013 diminutive adenoma removed     Past Surgical History  Procedure Laterality Date  . Eus  2010  . Cystoscopy tumor / condylomata w/ laser    . Upper gastrointestinal endoscopy  2009   Family History  Problem Relation Age of Onset  . Diabetes Mother     type 2  . Hypertension Mother   . Hypothyroidism Mother   . Osteoporosis Mother   . Diabetes Father     type 2  . Heart disease  Father   . Hypertension Father   . Gout Father   . Congestive Heart Failure Father   . CVA Father   . Hypertension Brother   . Diabetes Sister   . Breast cancer Paternal Aunt   . Colon cancer Paternal Aunt    Social History  Substance Use Topics  . Smoking status: Never Smoker   . Smokeless tobacco: Never Used  . Alcohol Use: No   OB History    No data available     Review of Systems A complete 10 system review of systems was obtained and all systems are negative except as noted in the HPI and PMH.  Allergies  Tetracycline  Home Medications   Prior to Admission medications   Medication Sig Start Date End Date Taking? Authorizing Provider  acetaminophen (TYLENOL) 500 MG tablet Take 1 tablet (500 mg total) by mouth every 6 (six) hours as needed. 09/24/15   Gloriann Loan, PA-C  acyclovir (ZOVIRAX) 200 MG capsule Take 200 mg by mouth daily.    Historical Provider, MD  amLODipine (NORVASC) 5 MG tablet Take 5 mg by mouth daily.    Historical Provider, MD  aspirin 81 MG tablet Take 81 mg by mouth daily.    Historical Provider, MD  fluticasone (FLONASE) 50 MCG/ACT nasal spray  10/07/13  Historical Provider, MD  ibuprofen (ADVIL,MOTRIN) 800 MG tablet Take 1 tablet (800 mg total) by mouth 3 (three) times daily. 09/24/15   Gloriann Loan, PA-C  Linaclotide (LINZESS) 145 MCG CAPS capsule Take 1 capsule (145 mcg total) by mouth daily. 10/13/13   Gatha Mayer, MD  Norethin Ace-Eth Estrad-FE (MICROGESTIN FE 1/20 PO) Take 1 tablet by mouth daily.    Historical Provider, MD  Omega-3 Fatty Acids (OMEGA 3 PO) Take 1 tablet by mouth 2 (two) times daily.    Historical Provider, MD  simvastatin (ZOCOR) 20 MG tablet Take 20 mg by mouth daily.    Historical Provider, MD   BP 144/87 mmHg  Pulse 115  Temp(Src) 99.2 F (37.3 C) (Oral)  Resp 16  SpO2 98% Physical Exam  Constitutional: She is oriented to person, place, and time. She appears well-developed and well-nourished.  HENT:  Head:  Normocephalic and atraumatic.  Mouth/Throat: Oropharynx is clear and moist. No oropharyngeal exudate.  Eyes: Conjunctivae are normal. Pupils are equal, round, and reactive to light.  Neck: Normal range of motion. Neck supple. No tracheal deviation present.  Cardiovascular: Normal rate, regular rhythm, normal heart sounds and intact distal pulses.   Pulmonary/Chest: Effort normal and breath sounds normal. No respiratory distress. She has no wheezes. She has no rales. She exhibits no tenderness, no deformity and no swelling.  No chest wall or rib tenderness.   Abdominal: Soft. Bowel sounds are normal. She exhibits no distension. There is no tenderness. There is no rebound and no guarding.  No Grey Turner's sign.  No ecchymosis, bruising, abrasions, or signs of trauma.  Musculoskeletal: Normal range of motion.       Right hip: Normal.       Left hip: Normal.       Cervical back: Normal.       Thoracic back: Normal.       Lumbar back: She exhibits tenderness.       Back:  No C/T/L midline tenderness.  No step offs or crepitus.  Neurological: She is alert and oriented to person, place, and time.  Speech clear without dysarthria.  Strength and sensation intact bilaterally throughout upper and lower extremities.  No saddle anesthesia.   Skin: Skin is warm, dry and intact. No abrasion, no bruising, no ecchymosis and no laceration noted. She is not diaphoretic. No erythema.  Psychiatric: She has a normal mood and affect. Her behavior is normal.  Nursing note and vitals reviewed.  ED Course  Procedures  DIAGNOSTIC STUDIES: Oxygen Saturation is 98% on RA, normal by my interpretation.    COORDINATION OF CARE: 8:35 PM Pt presents today due to lower back pain from a fall. Discussed treatment plan with pt at bedside including 1x ibuprofen, 1x tylenol and ice for the affected region. Return precautions noted. Pt agreed to plan.  Labs Review Labs Reviewed - No data to display  Imaging Review No  results found.   EKG Interpretation None      MDM   Final diagnoses:  Fall, initial encounter  Right low back pain, with sciatica presence unspecified    Patient presents after fall x 1 day ago complaining of right sided low back pain.  No red flags.  VSS, NAD.  On exam, no midline tenderness, step offs, or crepitus.  No chest wall or rib tenderness.  No focal neurological deficits.  No saddle anesthesia.  Strength and sensation intact throughout UE and LE.  Heart RRR, lungs CTAB, abdomen soft and benign.  No ecchymosis, bruising, abrasions, hematoma.  No Grey Turner sign.  Doubt retroperitoneal hemorrhage.  No indication for imaging at this time.  Will give motrin, tylenol, and ice.   Evaluation does not show pathology requring ongoing emergent intervention or admission. Pt is hemodynamically stable and mentating appropriately. Discussed findings/results and plan with patient/guardian, who agrees with plan. All questions answered. Return precautions discussed and outpatient follow up given.    I personally performed the services described in this documentation, which was scribed in my presence. The recorded information has been reviewed and is accurate.    Gloriann Loan, PA-C 09/24/15 [id number]  Merrily Pew, MD 09/27/15 1248

## 2016-02-03 DIAGNOSIS — Z1389 Encounter for screening for other disorder: Secondary | ICD-10-CM | POA: Diagnosis not present

## 2016-02-03 DIAGNOSIS — E119 Type 2 diabetes mellitus without complications: Secondary | ICD-10-CM | POA: Diagnosis not present

## 2016-02-03 DIAGNOSIS — N951 Menopausal and female climacteric states: Secondary | ICD-10-CM | POA: Diagnosis not present

## 2016-02-03 DIAGNOSIS — E663 Overweight: Secondary | ICD-10-CM | POA: Diagnosis not present

## 2016-02-03 DIAGNOSIS — I10 Essential (primary) hypertension: Secondary | ICD-10-CM | POA: Diagnosis not present

## 2016-02-29 DIAGNOSIS — H5213 Myopia, bilateral: Secondary | ICD-10-CM | POA: Diagnosis not present

## 2016-02-29 DIAGNOSIS — E119 Type 2 diabetes mellitus without complications: Secondary | ICD-10-CM | POA: Diagnosis not present

## 2016-05-14 DIAGNOSIS — Z1231 Encounter for screening mammogram for malignant neoplasm of breast: Secondary | ICD-10-CM | POA: Diagnosis not present

## 2016-06-04 DIAGNOSIS — R5383 Other fatigue: Secondary | ICD-10-CM | POA: Diagnosis not present

## 2016-06-04 DIAGNOSIS — I10 Essential (primary) hypertension: Secondary | ICD-10-CM | POA: Diagnosis not present

## 2016-06-04 DIAGNOSIS — E663 Overweight: Secondary | ICD-10-CM | POA: Diagnosis not present

## 2016-06-04 DIAGNOSIS — E119 Type 2 diabetes mellitus without complications: Secondary | ICD-10-CM | POA: Diagnosis not present

## 2016-09-13 ENCOUNTER — Other Ambulatory Visit: Payer: Self-pay | Admitting: Obstetrics and Gynecology

## 2016-09-13 ENCOUNTER — Other Ambulatory Visit (HOSPITAL_COMMUNITY)
Admission: RE | Admit: 2016-09-13 | Discharge: 2016-09-13 | Disposition: A | Discharge status: Home / Self Care | Payer: BLUE CROSS/BLUE SHIELD | Source: Ambulatory Visit | Attending: Obstetrics and Gynecology | Admitting: Obstetrics and Gynecology

## 2016-09-13 DIAGNOSIS — Z01419 Encounter for gynecological examination (general) (routine) without abnormal findings: Secondary | ICD-10-CM | POA: Diagnosis not present

## 2016-09-13 DIAGNOSIS — Z1151 Encounter for screening for human papillomavirus (HPV): Secondary | ICD-10-CM | POA: Insufficient documentation

## 2016-09-18 LAB — CYTOLOGY - PAP
Diagnosis: NEGATIVE
HPV (WINDOPATH): NOT DETECTED

## 2016-10-02 DIAGNOSIS — N390 Urinary tract infection, site not specified: Secondary | ICD-10-CM | POA: Diagnosis not present

## 2016-10-02 DIAGNOSIS — E119 Type 2 diabetes mellitus without complications: Secondary | ICD-10-CM | POA: Diagnosis not present

## 2016-10-02 DIAGNOSIS — R8299 Other abnormal findings in urine: Secondary | ICD-10-CM | POA: Diagnosis not present

## 2016-10-02 DIAGNOSIS — E784 Other hyperlipidemia: Secondary | ICD-10-CM | POA: Diagnosis not present

## 2016-10-02 DIAGNOSIS — I10 Essential (primary) hypertension: Secondary | ICD-10-CM | POA: Diagnosis not present

## 2016-10-09 DIAGNOSIS — Z0000 Encounter for general adult medical examination without abnormal findings: Secondary | ICD-10-CM | POA: Diagnosis not present

## 2016-10-09 DIAGNOSIS — E663 Overweight: Secondary | ICD-10-CM | POA: Diagnosis not present

## 2016-10-09 DIAGNOSIS — N951 Menopausal and female climacteric states: Secondary | ICD-10-CM | POA: Diagnosis not present

## 2016-10-09 DIAGNOSIS — E119 Type 2 diabetes mellitus without complications: Secondary | ICD-10-CM | POA: Diagnosis not present

## 2016-10-09 DIAGNOSIS — E784 Other hyperlipidemia: Secondary | ICD-10-CM | POA: Diagnosis not present

## 2016-10-09 DIAGNOSIS — Z1389 Encounter for screening for other disorder: Secondary | ICD-10-CM | POA: Diagnosis not present

## 2016-10-09 DIAGNOSIS — Z6827 Body mass index (BMI) 27.0-27.9, adult: Secondary | ICD-10-CM | POA: Diagnosis not present

## 2016-10-11 DIAGNOSIS — Z23 Encounter for immunization: Secondary | ICD-10-CM | POA: Diagnosis not present

## 2016-10-15 DIAGNOSIS — Z1212 Encounter for screening for malignant neoplasm of rectum: Secondary | ICD-10-CM | POA: Diagnosis not present

## 2016-11-20 DIAGNOSIS — I10 Essential (primary) hypertension: Secondary | ICD-10-CM | POA: Diagnosis not present

## 2016-11-20 DIAGNOSIS — E119 Type 2 diabetes mellitus without complications: Secondary | ICD-10-CM | POA: Diagnosis not present

## 2016-11-20 DIAGNOSIS — M25561 Pain in right knee: Secondary | ICD-10-CM | POA: Diagnosis not present

## 2016-11-20 DIAGNOSIS — M1990 Unspecified osteoarthritis, unspecified site: Secondary | ICD-10-CM | POA: Diagnosis not present

## 2016-11-27 DIAGNOSIS — M25561 Pain in right knee: Secondary | ICD-10-CM | POA: Diagnosis not present

## 2017-02-08 DIAGNOSIS — M25561 Pain in right knee: Secondary | ICD-10-CM | POA: Diagnosis not present

## 2017-02-08 DIAGNOSIS — E119 Type 2 diabetes mellitus without complications: Secondary | ICD-10-CM | POA: Diagnosis not present

## 2017-02-08 DIAGNOSIS — E663 Overweight: Secondary | ICD-10-CM | POA: Diagnosis not present

## 2017-02-08 DIAGNOSIS — R5383 Other fatigue: Secondary | ICD-10-CM | POA: Diagnosis not present

## 2017-03-05 DIAGNOSIS — E119 Type 2 diabetes mellitus without complications: Secondary | ICD-10-CM | POA: Diagnosis not present

## 2017-03-05 DIAGNOSIS — H5211 Myopia, right eye: Secondary | ICD-10-CM | POA: Diagnosis not present

## 2017-03-18 DIAGNOSIS — M722 Plantar fascial fibromatosis: Secondary | ICD-10-CM | POA: Diagnosis not present

## 2017-03-18 DIAGNOSIS — M79671 Pain in right foot: Secondary | ICD-10-CM | POA: Diagnosis not present

## 2017-03-18 DIAGNOSIS — E139 Other specified diabetes mellitus without complications: Secondary | ICD-10-CM | POA: Diagnosis not present

## 2017-03-18 DIAGNOSIS — R238 Other skin changes: Secondary | ICD-10-CM | POA: Diagnosis not present

## 2017-04-09 DIAGNOSIS — M722 Plantar fascial fibromatosis: Secondary | ICD-10-CM | POA: Diagnosis not present

## 2017-05-15 DIAGNOSIS — Z1231 Encounter for screening mammogram for malignant neoplasm of breast: Secondary | ICD-10-CM | POA: Diagnosis not present

## 2017-05-23 DIAGNOSIS — M79671 Pain in right foot: Secondary | ICD-10-CM | POA: Diagnosis not present

## 2017-05-23 DIAGNOSIS — M722 Plantar fascial fibromatosis: Secondary | ICD-10-CM | POA: Diagnosis not present

## 2017-05-23 DIAGNOSIS — M84375A Stress fracture, left foot, initial encounter for fracture: Secondary | ICD-10-CM | POA: Diagnosis not present

## 2017-05-23 DIAGNOSIS — R609 Edema, unspecified: Secondary | ICD-10-CM | POA: Diagnosis not present

## 2017-06-04 DIAGNOSIS — M722 Plantar fascial fibromatosis: Secondary | ICD-10-CM | POA: Diagnosis not present

## 2017-06-04 DIAGNOSIS — R609 Edema, unspecified: Secondary | ICD-10-CM | POA: Diagnosis not present

## 2017-06-04 DIAGNOSIS — M84375D Stress fracture, left foot, subsequent encounter for fracture with routine healing: Secondary | ICD-10-CM | POA: Diagnosis not present

## 2017-06-06 DIAGNOSIS — M79673 Pain in unspecified foot: Secondary | ICD-10-CM | POA: Diagnosis not present

## 2017-06-06 DIAGNOSIS — R5383 Other fatigue: Secondary | ICD-10-CM | POA: Diagnosis not present

## 2017-06-06 DIAGNOSIS — M25561 Pain in right knee: Secondary | ICD-10-CM | POA: Diagnosis not present

## 2017-06-06 DIAGNOSIS — E119 Type 2 diabetes mellitus without complications: Secondary | ICD-10-CM | POA: Diagnosis not present

## 2017-06-10 DIAGNOSIS — Z13820 Encounter for screening for osteoporosis: Secondary | ICD-10-CM | POA: Diagnosis not present

## 2017-06-18 DIAGNOSIS — R609 Edema, unspecified: Secondary | ICD-10-CM | POA: Diagnosis not present

## 2017-06-18 DIAGNOSIS — S92352G Displaced fracture of fifth metatarsal bone, left foot, subsequent encounter for fracture with delayed healing: Secondary | ICD-10-CM | POA: Diagnosis not present

## 2017-06-18 DIAGNOSIS — M722 Plantar fascial fibromatosis: Secondary | ICD-10-CM | POA: Diagnosis not present

## 2017-07-08 DIAGNOSIS — M722 Plantar fascial fibromatosis: Secondary | ICD-10-CM | POA: Diagnosis not present

## 2017-07-08 DIAGNOSIS — R609 Edema, unspecified: Secondary | ICD-10-CM | POA: Diagnosis not present

## 2017-07-08 DIAGNOSIS — S92352G Displaced fracture of fifth metatarsal bone, left foot, subsequent encounter for fracture with delayed healing: Secondary | ICD-10-CM | POA: Diagnosis not present

## 2017-07-24 DIAGNOSIS — R609 Edema, unspecified: Secondary | ICD-10-CM | POA: Diagnosis not present

## 2017-07-24 DIAGNOSIS — S92352G Displaced fracture of fifth metatarsal bone, left foot, subsequent encounter for fracture with delayed healing: Secondary | ICD-10-CM | POA: Diagnosis not present

## 2017-07-24 DIAGNOSIS — M722 Plantar fascial fibromatosis: Secondary | ICD-10-CM | POA: Diagnosis not present

## 2017-07-31 DIAGNOSIS — Z23 Encounter for immunization: Secondary | ICD-10-CM | POA: Diagnosis not present

## 2017-08-07 DIAGNOSIS — S92352G Displaced fracture of fifth metatarsal bone, left foot, subsequent encounter for fracture with delayed healing: Secondary | ICD-10-CM | POA: Diagnosis not present

## 2017-08-07 DIAGNOSIS — M722 Plantar fascial fibromatosis: Secondary | ICD-10-CM | POA: Diagnosis not present

## 2017-08-07 DIAGNOSIS — R609 Edema, unspecified: Secondary | ICD-10-CM | POA: Diagnosis not present

## 2017-08-28 DIAGNOSIS — S92352G Displaced fracture of fifth metatarsal bone, left foot, subsequent encounter for fracture with delayed healing: Secondary | ICD-10-CM | POA: Diagnosis not present

## 2017-08-28 DIAGNOSIS — E139 Other specified diabetes mellitus without complications: Secondary | ICD-10-CM | POA: Diagnosis not present

## 2017-09-17 DIAGNOSIS — Z01419 Encounter for gynecological examination (general) (routine) without abnormal findings: Secondary | ICD-10-CM | POA: Diagnosis not present

## 2017-10-04 DIAGNOSIS — I10 Essential (primary) hypertension: Secondary | ICD-10-CM | POA: Diagnosis not present

## 2017-10-04 DIAGNOSIS — R946 Abnormal results of thyroid function studies: Secondary | ICD-10-CM | POA: Diagnosis not present

## 2017-10-04 DIAGNOSIS — M859 Disorder of bone density and structure, unspecified: Secondary | ICD-10-CM | POA: Diagnosis not present

## 2017-10-04 DIAGNOSIS — E119 Type 2 diabetes mellitus without complications: Secondary | ICD-10-CM | POA: Diagnosis not present

## 2017-10-04 DIAGNOSIS — Z0000 Encounter for general adult medical examination without abnormal findings: Secondary | ICD-10-CM | POA: Diagnosis not present

## 2017-10-11 DIAGNOSIS — R82998 Other abnormal findings in urine: Secondary | ICD-10-CM | POA: Diagnosis not present

## 2017-10-11 DIAGNOSIS — E119 Type 2 diabetes mellitus without complications: Secondary | ICD-10-CM | POA: Diagnosis not present

## 2017-10-11 DIAGNOSIS — Z0000 Encounter for general adult medical examination without abnormal findings: Secondary | ICD-10-CM | POA: Diagnosis not present

## 2017-10-11 DIAGNOSIS — Z1212 Encounter for screening for malignant neoplasm of rectum: Secondary | ICD-10-CM | POA: Diagnosis not present

## 2017-10-11 DIAGNOSIS — Z23 Encounter for immunization: Secondary | ICD-10-CM | POA: Diagnosis not present

## 2017-10-11 DIAGNOSIS — R5383 Other fatigue: Secondary | ICD-10-CM | POA: Diagnosis not present

## 2017-10-11 DIAGNOSIS — G608 Other hereditary and idiopathic neuropathies: Secondary | ICD-10-CM | POA: Diagnosis not present

## 2017-10-11 DIAGNOSIS — M859 Disorder of bone density and structure, unspecified: Secondary | ICD-10-CM | POA: Diagnosis not present

## 2017-10-11 DIAGNOSIS — Z6829 Body mass index (BMI) 29.0-29.9, adult: Secondary | ICD-10-CM | POA: Diagnosis not present

## 2017-10-11 DIAGNOSIS — Z1389 Encounter for screening for other disorder: Secondary | ICD-10-CM | POA: Diagnosis not present

## 2018-02-19 DIAGNOSIS — M79672 Pain in left foot: Secondary | ICD-10-CM | POA: Diagnosis not present

## 2018-02-19 DIAGNOSIS — G629 Polyneuropathy, unspecified: Secondary | ICD-10-CM | POA: Diagnosis not present

## 2018-02-19 DIAGNOSIS — E139 Other specified diabetes mellitus without complications: Secondary | ICD-10-CM | POA: Diagnosis not present

## 2018-02-19 DIAGNOSIS — M79671 Pain in right foot: Secondary | ICD-10-CM | POA: Diagnosis not present

## 2018-02-19 DIAGNOSIS — D485 Neoplasm of uncertain behavior of skin: Secondary | ICD-10-CM | POA: Diagnosis not present

## 2018-02-28 DIAGNOSIS — E663 Overweight: Secondary | ICD-10-CM | POA: Diagnosis not present

## 2018-02-28 DIAGNOSIS — Z1389 Encounter for screening for other disorder: Secondary | ICD-10-CM | POA: Diagnosis not present

## 2018-02-28 DIAGNOSIS — M859 Disorder of bone density and structure, unspecified: Secondary | ICD-10-CM | POA: Diagnosis not present

## 2018-02-28 DIAGNOSIS — E119 Type 2 diabetes mellitus without complications: Secondary | ICD-10-CM | POA: Diagnosis not present

## 2018-02-28 DIAGNOSIS — G609 Hereditary and idiopathic neuropathy, unspecified: Secondary | ICD-10-CM | POA: Diagnosis not present

## 2018-03-12 DIAGNOSIS — H524 Presbyopia: Secondary | ICD-10-CM | POA: Diagnosis not present

## 2018-03-12 DIAGNOSIS — E119 Type 2 diabetes mellitus without complications: Secondary | ICD-10-CM | POA: Diagnosis not present

## 2018-03-13 DIAGNOSIS — M79671 Pain in right foot: Secondary | ICD-10-CM | POA: Diagnosis not present

## 2018-03-13 DIAGNOSIS — E139 Other specified diabetes mellitus without complications: Secondary | ICD-10-CM | POA: Diagnosis not present

## 2018-03-13 DIAGNOSIS — D485 Neoplasm of uncertain behavior of skin: Secondary | ICD-10-CM | POA: Diagnosis not present

## 2018-03-13 DIAGNOSIS — M79672 Pain in left foot: Secondary | ICD-10-CM | POA: Diagnosis not present

## 2018-04-17 DIAGNOSIS — E139 Other specified diabetes mellitus without complications: Secondary | ICD-10-CM | POA: Diagnosis not present

## 2018-04-17 DIAGNOSIS — M79671 Pain in right foot: Secondary | ICD-10-CM | POA: Diagnosis not present

## 2018-04-17 DIAGNOSIS — M79672 Pain in left foot: Secondary | ICD-10-CM | POA: Diagnosis not present

## 2018-05-17 DIAGNOSIS — Z1231 Encounter for screening mammogram for malignant neoplasm of breast: Secondary | ICD-10-CM | POA: Diagnosis not present

## 2018-07-18 DIAGNOSIS — S83242A Other tear of medial meniscus, current injury, left knee, initial encounter: Secondary | ICD-10-CM | POA: Diagnosis not present

## 2018-07-24 DIAGNOSIS — M25562 Pain in left knee: Secondary | ICD-10-CM | POA: Diagnosis not present

## 2018-10-08 DIAGNOSIS — M1990 Unspecified osteoarthritis, unspecified site: Secondary | ICD-10-CM | POA: Diagnosis not present

## 2018-10-08 DIAGNOSIS — E119 Type 2 diabetes mellitus without complications: Secondary | ICD-10-CM | POA: Diagnosis not present

## 2018-10-08 DIAGNOSIS — Z0000 Encounter for general adult medical examination without abnormal findings: Secondary | ICD-10-CM | POA: Diagnosis not present

## 2018-10-08 DIAGNOSIS — M859 Disorder of bone density and structure, unspecified: Secondary | ICD-10-CM | POA: Diagnosis not present

## 2018-10-08 DIAGNOSIS — E040 Nontoxic diffuse goiter: Secondary | ICD-10-CM | POA: Diagnosis not present

## 2018-10-08 DIAGNOSIS — R82998 Other abnormal findings in urine: Secondary | ICD-10-CM | POA: Diagnosis not present

## 2018-10-10 DIAGNOSIS — Z1212 Encounter for screening for malignant neoplasm of rectum: Secondary | ICD-10-CM | POA: Diagnosis not present

## 2018-10-16 DIAGNOSIS — Z1389 Encounter for screening for other disorder: Secondary | ICD-10-CM | POA: Diagnosis not present

## 2018-10-16 DIAGNOSIS — Z23 Encounter for immunization: Secondary | ICD-10-CM | POA: Diagnosis not present

## 2018-10-16 DIAGNOSIS — E7849 Other hyperlipidemia: Secondary | ICD-10-CM | POA: Diagnosis not present

## 2018-10-16 DIAGNOSIS — I10 Essential (primary) hypertension: Secondary | ICD-10-CM | POA: Diagnosis not present

## 2018-10-16 DIAGNOSIS — G609 Hereditary and idiopathic neuropathy, unspecified: Secondary | ICD-10-CM | POA: Diagnosis not present

## 2018-10-16 DIAGNOSIS — Z0000 Encounter for general adult medical examination without abnormal findings: Secondary | ICD-10-CM | POA: Diagnosis not present

## 2018-10-16 DIAGNOSIS — M859 Disorder of bone density and structure, unspecified: Secondary | ICD-10-CM | POA: Diagnosis not present

## 2018-11-17 ENCOUNTER — Other Ambulatory Visit (HOSPITAL_COMMUNITY)
Admission: RE | Admit: 2018-11-17 | Discharge: 2018-11-17 | Disposition: A | Discharge status: Home / Self Care | Payer: BLUE CROSS/BLUE SHIELD | Source: Ambulatory Visit | Attending: Obstetrics and Gynecology | Admitting: Obstetrics and Gynecology

## 2018-11-17 ENCOUNTER — Other Ambulatory Visit: Payer: Self-pay | Admitting: Obstetrics and Gynecology

## 2018-11-17 DIAGNOSIS — Z01419 Encounter for gynecological examination (general) (routine) without abnormal findings: Secondary | ICD-10-CM | POA: Insufficient documentation

## 2018-11-21 LAB — CYTOLOGY - PAP
DIAGNOSIS: NEGATIVE
HPV (WINDOPATH): NOT DETECTED

## 2019-04-21 DIAGNOSIS — B351 Tinea unguium: Secondary | ICD-10-CM | POA: Diagnosis not present

## 2019-04-21 DIAGNOSIS — E139 Other specified diabetes mellitus without complications: Secondary | ICD-10-CM | POA: Diagnosis not present

## 2019-04-21 DIAGNOSIS — M79671 Pain in right foot: Secondary | ICD-10-CM | POA: Diagnosis not present

## 2019-04-21 DIAGNOSIS — M79672 Pain in left foot: Secondary | ICD-10-CM | POA: Diagnosis not present

## 2019-04-28 DIAGNOSIS — E119 Type 2 diabetes mellitus without complications: Secondary | ICD-10-CM | POA: Diagnosis not present

## 2019-04-28 DIAGNOSIS — R5383 Other fatigue: Secondary | ICD-10-CM | POA: Diagnosis not present

## 2019-04-28 DIAGNOSIS — L309 Dermatitis, unspecified: Secondary | ICD-10-CM | POA: Diagnosis not present

## 2019-04-28 DIAGNOSIS — G609 Hereditary and idiopathic neuropathy, unspecified: Secondary | ICD-10-CM | POA: Diagnosis not present

## 2019-04-29 DIAGNOSIS — E119 Type 2 diabetes mellitus without complications: Secondary | ICD-10-CM | POA: Diagnosis not present

## 2019-05-23 DIAGNOSIS — Z1231 Encounter for screening mammogram for malignant neoplasm of breast: Secondary | ICD-10-CM | POA: Diagnosis not present

## 2019-10-12 DIAGNOSIS — M859 Disorder of bone density and structure, unspecified: Secondary | ICD-10-CM | POA: Diagnosis not present

## 2019-10-12 DIAGNOSIS — E7849 Other hyperlipidemia: Secondary | ICD-10-CM | POA: Diagnosis not present

## 2019-10-12 DIAGNOSIS — E119 Type 2 diabetes mellitus without complications: Secondary | ICD-10-CM | POA: Diagnosis not present

## 2019-10-12 DIAGNOSIS — R946 Abnormal results of thyroid function studies: Secondary | ICD-10-CM | POA: Diagnosis not present

## 2019-10-13 DIAGNOSIS — R82998 Other abnormal findings in urine: Secondary | ICD-10-CM | POA: Diagnosis not present

## 2021-05-31 ENCOUNTER — Encounter: Payer: Self-pay | Admitting: Internal Medicine

## 2021-06-13 ENCOUNTER — Encounter: Payer: Self-pay | Admitting: Internal Medicine

## 2021-07-12 ENCOUNTER — Other Ambulatory Visit: Payer: Self-pay

## 2021-07-12 ENCOUNTER — Ambulatory Visit (AMBULATORY_SURGERY_CENTER): Payer: 59

## 2021-07-12 VITALS — Ht 60.0 in | Wt 146.0 lb

## 2021-07-12 DIAGNOSIS — Z86010 Personal history of colonic polyps: Secondary | ICD-10-CM

## 2021-07-12 MED ORDER — NA SULFATE-K SULFATE-MG SULF 17.5-3.13-1.6 GM/177ML PO SOLN: 1.0000 | Freq: Once | ORAL | 0 refills | Status: AC

## 2021-07-12 NOTE — Progress Notes (Signed)
      Patient's pre-visit was done today over the phone with the patient   Name,DOB and address verified.   Patient denies any allergies to Eggs and Soy.  Patient denies any problems with anesthesia/sedation. Patient denies taking diet pills or blood thinners.  Denies atrial flutter or atrial fib   Chronic constipation, better with probiotic but goes every 2-3 days   No home Oxygen.   Packet of Prep instructions mailed to patient including a copy of a consent form-pt is aware.  Patient understands to call us back with any questions or concerns.  Patient is aware of our care-partner policy and 0000000 safety protocol.   EMMI education assigned to the patient for the procedure, sent to [place].   The patient is COVID-19 vaccinated.    Pt is a diabetic and also needed a 2 day prep.

## 2021-07-14 ENCOUNTER — Encounter: Payer: Self-pay | Admitting: Internal Medicine

## 2021-07-27 ENCOUNTER — Other Ambulatory Visit: Payer: Self-pay

## 2021-07-27 ENCOUNTER — Ambulatory Visit (AMBULATORY_SURGERY_CENTER): Payer: BLUE CROSS/BLUE SHIELD | Admitting: Internal Medicine

## 2021-07-27 ENCOUNTER — Encounter: Payer: Self-pay | Admitting: Internal Medicine

## 2021-07-27 VITALS — BP 111/66 | HR 60 | Temp 97.7°F | Resp 16 | Ht 60.0 in | Wt 146.0 lb

## 2021-07-27 DIAGNOSIS — D125 Benign neoplasm of sigmoid colon: Secondary | ICD-10-CM

## 2021-07-27 DIAGNOSIS — K635 Polyp of colon: Secondary | ICD-10-CM

## 2021-07-27 DIAGNOSIS — Z86010 Personal history of colonic polyps: Secondary | ICD-10-CM

## 2021-07-27 MED ORDER — SODIUM CHLORIDE 0.9 % IV SOLN: 500.0000 mL | Freq: Once | INTRAVENOUS | Status: DC

## 2021-07-27 NOTE — Progress Notes (Signed)
[facility] History and Physical   Primary Care Physician:  Shon Baton, MD   Reason for Procedure:   Hx adenomatous colon polyp  Plan:    colonoscopy     HPI: [name] [name] is a 55 y.o. female w/ hx diminutive adenoma 2015 - here for repeat colonoscopy   Past Medical History:  Diagnosis Date   Allergic rhinitis    Condylomata acuminata    CTS (carpal tunnel syndrome)    bilateral   Diabetes mellitus without complication ([place])    Dyslipidemia    Functional dyspepsia 10/29/2008   Normal EGD and EUS   Goiter    throid   HSV (herpes simplex virus) infection    Hyperlipidemia    Hypertension    OA (osteoarthritis)    Bilateral ankles   Persistent vomiting 07/29/2008   Personal history of colonic adenoma 11/12/2013   11/12/2013 diminutive adenoma removed     Uterine fibroid    Vulvar intraepithelial neoplasia I 10/29/2010    Past Surgical History:  Procedure Laterality Date   COLOSTOMY     CYSTOSCOPY TUMOR / CONDYLOMATA W/ LASER     EUS  10/29/2008   UPPER GASTROINTESTINAL ENDOSCOPY  10/30/2007    Prior to Admission medications   Medication Sig Start Date End Date Taking? Authorizing Provider  acyclovir (ZOVIRAX) 200 MG capsule Take 200 mg by mouth daily.   Yes [provider]  amLODipine (NORVASC) 5 MG tablet Take 5 mg by mouth daily.   Yes [provider]  Ascorbic Acid (VITAMIN C PO) Take by mouth.   Yes [provider]  aspirin 81 MG tablet Take 81 mg by mouth daily.   Yes [provider]  atorvastatin (LIPITOR) 40 MG tablet Take 40 mg by mouth daily. 04/30/21  Yes [provider]  CALCIUM PO Take by mouth.   Yes [provider]  Cyanocobalamin (VITAMIN B 12 PO) Take by mouth.   Yes [provider]  gabapentin (NEURONTIN) 300 MG capsule Take 300 mg by mouth at bedtime. 05/29/21  Yes [provider]  Glucosamine HCl (GLUCOSAMINE PO) Take by mouth.   Yes [provider]   metFORMIN (GLUCOPHAGE) 500 MG tablet Take by mouth. 05/29/21  Yes [provider]  metoprolol succinate (TOPROL-XL) 25 MG 24 hr tablet Take 25 mg by mouth daily. 05/29/21  Yes [provider]  Omega-3 Fatty Acids (OMEGA 3 PO) Take 1 tablet by mouth 2 (two) times daily.   Yes [provider]  acetaminophen (TYLENOL) 500 MG tablet Take 1 tablet (500 mg total) by mouth every 6 (six) hours as needed. 09/24/15   Gloriann Loan, PA-C  fluticasone Asencion Islam) 50 MCG/ACT nasal spray  10/07/13   [provider]  ibuprofen (ADVIL,MOTRIN) 800 MG tablet Take 1 tablet (800 mg total) by mouth 3 (three) times daily. 09/24/15   Gloriann Loan, PA-C  Linaclotide (LINZESS) 145 MCG CAPS capsule Take 1 capsule (145 mcg total) by mouth daily. Patient not taking: No sig reported 10/13/13   Gatha Mayer, MD  Multiple Vitamin (MULTI-VITAMIN PO) Take by mouth.    [provider]  Norethin Ace-Eth Estrad-FE (MICROGESTIN FE 1/20 PO) Take 1 tablet by mouth daily. Patient not taking: No sig reported    [provider]  Probiotic Product (PROBIOTIC PO) Take by mouth.    [provider]    Current Outpatient Medications  Medication Sig Dispense Refill   acyclovir (ZOVIRAX) 200 MG capsule Take 200 mg by mouth daily.  amLODipine (NORVASC) 5 MG tablet Take 5 mg by mouth daily.     Ascorbic Acid (VITAMIN C PO) Take by mouth.     aspirin 81 MG tablet Take 81 mg by mouth daily.     atorvastatin (LIPITOR) 40 MG tablet Take 40 mg by mouth daily.     CALCIUM PO Take by mouth.     Cyanocobalamin (VITAMIN B 12 PO) Take by mouth.     gabapentin (NEURONTIN) 300 MG capsule Take 300 mg by mouth at bedtime.     Glucosamine HCl (GLUCOSAMINE PO) Take by mouth.     metFORMIN (GLUCOPHAGE) 500 MG tablet Take by mouth.     metoprolol succinate (TOPROL-XL) 25 MG 24 hr tablet Take 25 mg by mouth daily.     Omega-3 Fatty Acids (OMEGA 3 PO) Take 1 tablet by mouth 2 (two) times daily.      acetaminophen (TYLENOL) 500 MG tablet Take 1 tablet (500 mg total) by mouth every 6 (six) hours as needed. 30 tablet 0   fluticasone (FLONASE) 50 MCG/ACT nasal spray      ibuprofen (ADVIL,MOTRIN) 800 MG tablet Take 1 tablet (800 mg total) by mouth 3 (three) times daily. 21 tablet 0   Linaclotide (LINZESS) 145 MCG CAPS capsule Take 1 capsule (145 mcg total) by mouth daily. (Patient not taking: No sig reported) 30 capsule 11   Multiple Vitamin (MULTI-VITAMIN PO) Take by mouth.     Norethin Ace-Eth Estrad-FE (MICROGESTIN FE 1/20 PO) Take 1 tablet by mouth daily. (Patient not taking: No sig reported)     Probiotic Product (PROBIOTIC PO) Take by mouth.     Current Facility-Administered Medications  Medication Dose Route Frequency Provider Last Rate Last Admin   0.9 %  sodium chloride infusion  500 mL Intravenous Once Gatha Mayer, MD        Allergies as of 07/27/2021 - Review Complete 07/27/2021  Allergen Reaction Noted   Lisinopril  07/12/2021   Tetracycline      Family History  Problem Relation Age of Onset   Diabetes Mother        type 2   Hypertension Mother    Hypothyroidism Mother    Osteoporosis Mother    Diabetes Father        type 2   Heart disease Father    Hypertension Father    Gout Father    Congestive Heart Failure Father    CVA Father    Diabetes Sister    Hypertension Brother    Breast cancer Paternal Aunt    Colon cancer Paternal Aunt    Colon polyps Neg Hx    Esophageal cancer Neg Hx    Stomach cancer Neg Hx    Rectal cancer Neg Hx     Social History   Socioeconomic History   Marital status: Single    Spouse name: Not on file   Number of children: 0   Years of education: Not on file   Highest education level: Not on file  Occupational History   Occupation: Presser/ Dry Comptroller: [employer]  Tobacco Use   Smoking status: Never   Smokeless tobacco: Never  Vaping Use   Vaping Use: Never used  Substance and Sexual Activity    Alcohol use: No   Drug use: No   Sexual activity: Not on file  Other Topics Concern   Not on file  Social History Narrative   Single, no children   Works full-time  in dry cleaners and part-time janitorial   1-2 caffeine drinks daily   Social Determinants of Health   Financial Resource Strain: Not on file  Food Insecurity: Not on file  Transportation Needs: Not on file  Physical Activity: Not on file  Stress: Not on file  Social Connections: Not on file  Intimate Partner Violence: Not on file    Review of Systems:  All other review of systems negative except as mentioned in the HPI.  Physical Exam: Vital signs BP 134/73   Pulse 85   Temp 97.7 F (36.5 C) (Temporal)   Resp 14   Ht 5' (1.524 m)   Wt 146 lb (66.2 kg)   SpO2 100%   BMI 28.51 kg/m   General:   Alert,  Well-developed, well-nourished, pleasant and cooperative in NAD Lungs:  Clear throughout to auscultation.   Heart:  Regular rate and rhythm; no murmurs, clicks, rubs,  or gallops. Abdomen:  Soft, nontender and nondistended. Normal bowel sounds.   Neuro/Psych:  Alert and cooperative. Normal mood and affect. A and O x 3   @Shaylie Eklund  Simonne Maffucci, MD, [facility] [phone] (pager) 07/27/2021 2:04 PM@

## 2021-07-27 NOTE — Patient Instructions (Addendum)
I found and removed one tiny polyp again. I will let you know pathology results and when to have another routine colonoscopy by mail and/or My Chart.  You also have a condition called diverticulosis - common and not usually a problem. Please read the handout provided.  I appreciate the opportunity to care for you. Gatha Mayer, MD, FACG   YOU HAD AN ENDOSCOPIC PROCEDURE TODAY AT [facility]:   Refer to the procedure report that was given to you for any specific questions about what was found during the examination.  If the procedure report does not answer your questions, please call your gastroenterologist to clarify.  If you requested that your care partner not be given the details of your procedure findings, then the procedure report has been included in a sealed envelope for you to review at your convenience later.  YOU SHOULD EXPECT: Some feelings of bloating in the abdomen. Passage of more gas than usual.  Walking can help get rid of the air that was put into your GI tract during the procedure and reduce the bloating. If you had a lower endoscopy (such as a colonoscopy or flexible sigmoidoscopy) you may notice spotting of blood in your stool or on the toilet paper. If you underwent a bowel prep for your procedure, you may not have a normal bowel movement for a few days.  Please Note:  You might notice some irritation and congestion in your nose or some drainage.  This is from the oxygen used during your procedure.  There is no need for concern and it should clear up in a day or so.  SYMPTOMS TO REPORT IMMEDIATELY:  Following lower endoscopy (colonoscopy or flexible sigmoidoscopy):  Excessive amounts of blood in the stool  Significant tenderness or worsening of abdominal pains  Swelling of the abdomen that is new, acute  Fever of 100F or higher  For urgent or emergent issues, a gastroenterologist can be reached at any hour by calling [phone]. Do not use MyChart  messaging for urgent concerns.    DIET:  We do recommend a small meal at first, but then you may proceed to your regular diet.  Drink plenty of fluids but you should avoid alcoholic beverages for 24 hours.  ACTIVITY:  You should plan to take it easy for the rest of today and you should NOT DRIVE or use heavy machinery until tomorrow (because of the sedation medicines used during the test).    FOLLOW UP: Our staff will call the number listed on your records 48-72 hours following your procedure to check on you and address any questions or concerns that you may have regarding the information given to you following your procedure. If we do not reach you, we will leave a message.  We will attempt to reach you two times.  During this call, we will ask if you have developed any symptoms of COVID 19. If you develop any symptoms (ie: fever, flu-like symptoms, shortness of breath, cough etc.) before then, please call [phone].  If you test positive for Covid 19 in the 2 weeks post procedure, please call and report this information to [place].    If any biopsies were taken you will be contacted by phone or by letter within the next 1-3 weeks.  Please call us at [phone] if you have not heard about the biopsies in 3 weeks.    SIGNATURES/CONFIDENTIALITY: You and/or your care partner have signed paperwork which will be entered into  your electronic medical record.  These signatures attest to the fact that that the information above on your After Visit Summary has been reviewed and is understood.  Full responsibility of the confidentiality of this discharge information lies with you and/or your care-partner.

## 2021-07-27 NOTE — Op Note (Signed)
[facility] Patient Name: [name] [name] Procedure Date: 07/27/2021 1:26 PM MRN: [id number] Endoscopist: Gatha Mayer , MD Age: 55 Referring MD:  Date of Birth: Aug 14, 1966 Gender: Female Account #: [id number] Procedure:                Colonoscopy Indications:              Surveillance: Personal history of adenomatous                            polyps on last colonoscopy > 5 years ago, Last                            colonoscopy: 2015 Medicines:                Propofol per Anesthesia, Monitored Anesthesia Care Procedure:                Pre-Anesthesia Assessment:                           - Prior to the procedure, a History and Physical                            was performed, and patient medications and                            allergies were reviewed. The patient's tolerance of                            previous anesthesia was also reviewed. The risks                            and benefits of the procedure and the sedation                            options and risks were discussed with the patient.                            All questions were answered, and informed consent                            was obtained. Prior Anticoagulants: The patient has                            taken no previous anticoagulant or antiplatelet                            agents. ASA Grade Assessment: II - A patient with                            mild systemic disease. After reviewing the risks                            and benefits, the patient was deemed in  satisfactory condition to undergo the procedure.                           After obtaining informed consent, the colonoscope                            was passed under direct vision. Throughout the                            procedure, the patient's blood pressure, pulse, and                            oxygen saturations were monitored continuously. The                            Olympus PCF-H190DL  (#[id number]) Colonoscope was                            introduced through the anus and advanced to the the                            cecum, identified by appendiceal orifice and                            ileocecal valve. The colonoscopy was performed                            without difficulty. The patient tolerated the                            procedure well. The quality of the bowel                            preparation was good. The bowel preparation used                            was SUPREP via split dose instruction. The                            ileocecal valve, appendiceal orifice, and rectum                            were photographed. Scope In: 2:10:12 PM Scope Out: 2:26:06 PM Scope Withdrawal Time: 0 hours 12 minutes 38 seconds  Total Procedure Duration: 0 hours 15 minutes 54 seconds  Findings:                 The perianal and digital rectal examinations were                            normal.                           A diminutive polyp was found in the sigmoid colon.  The polyp was sessile. The polyp was removed with a                            cold snare. Resection and retrieval were complete.                            Verification of patient identification for the                            specimen was done. Estimated blood loss was minimal.                           Multiple diverticula were found in the sigmoid                            colon.                           The exam was otherwise without abnormality on                            direct and retroflexion views. Complications:            No immediate complications. Estimated Blood Loss:     Estimated blood loss was minimal. Impression:               - One diminutive polyp in the sigmoid colon,                            removed with a cold snare. Resected and retrieved.                           - Diverticulosis in the sigmoid colon.                           - The  examination was otherwise normal on direct                            and retroflexion views.                           - Personal history of colonic polyp - 1 diminutive                            adenoma 2015. Recommendation:           - Patient has a contact number available for                            emergencies. The signs and symptoms of potential                            delayed complications were discussed with the                            patient. Return to normal  activities tomorrow.                            Written discharge instructions were provided to the                            patient.                           - Resume previous diet.                           - Continue present medications.                           - Repeat colonoscopy is recommended for                            surveillance. The colonoscopy date will be                            determined after pathology results from today's                            exam become available for review. Gatha Mayer, MD 07/27/2021 2:33:46 PM This report has been signed electronically.

## 2021-07-27 NOTE — Progress Notes (Signed)
VS completed by DT.  Pt's states no medical or surgical changes since previsit or office visit.  

## 2021-07-27 NOTE — Progress Notes (Signed)
Called to room to assist during endoscopic procedure.  Patient ID and intended procedure confirmed with present staff. Received instructions for my participation in the procedure from the performing physician.  

## 2021-07-27 NOTE — Progress Notes (Signed)
Vss nad transferred to pacu 

## 2021-07-31 ENCOUNTER — Telehealth: Payer: Self-pay | Admitting: *Deleted

## 2021-07-31 NOTE — Telephone Encounter (Signed)
  Follow up Call-  Call back number 07/27/2021  Post procedure Call Back phone  # [phone]  Permission to leave phone message Yes  Some recent data might be hidden     Patient questions:  Do you have a fever, pain , or abdominal swelling? No. Pain Score  0 *  Have you tolerated food without any problems? Yes.    Have you been able to return to your normal activities? Yes.    Do you have any questions about your discharge instructions: Diet   No. Medications  No. Follow up visit  No.  Do you have questions or concerns about your Care? No.  Actions: * If pain score is 4 or above: No action needed, pain <4.

## 2021-08-03 ENCOUNTER — Encounter: Payer: Self-pay | Admitting: Internal Medicine

## 2021-12-18 ENCOUNTER — Other Ambulatory Visit (HOSPITAL_COMMUNITY)
Admission: RE | Admit: 2021-12-18 | Discharge: 2021-12-18 | Disposition: A | Discharge status: Home / Self Care | Payer: 59 | Source: Ambulatory Visit | Attending: Obstetrics and Gynecology | Admitting: Obstetrics and Gynecology

## 2021-12-18 ENCOUNTER — Other Ambulatory Visit: Payer: Self-pay | Admitting: Obstetrics and Gynecology

## 2021-12-18 DIAGNOSIS — Z01419 Encounter for gynecological examination (general) (routine) without abnormal findings: Secondary | ICD-10-CM | POA: Diagnosis present

## 2021-12-20 LAB — CYTOLOGY - PAP
Comment: NEGATIVE
Diagnosis: NEGATIVE
High risk HPV: NEGATIVE

## 2023-12-06 DIAGNOSIS — Z1212 Encounter for screening for malignant neoplasm of rectum: Secondary | ICD-10-CM | POA: Diagnosis not present

## 2023-12-06 DIAGNOSIS — I10 Essential (primary) hypertension: Secondary | ICD-10-CM | POA: Diagnosis not present

## 2023-12-06 DIAGNOSIS — E040 Nontoxic diffuse goiter: Secondary | ICD-10-CM | POA: Diagnosis not present

## 2023-12-06 DIAGNOSIS — E119 Type 2 diabetes mellitus without complications: Secondary | ICD-10-CM | POA: Diagnosis not present

## 2023-12-06 DIAGNOSIS — E785 Hyperlipidemia, unspecified: Secondary | ICD-10-CM | POA: Diagnosis not present

## 2023-12-06 DIAGNOSIS — E039 Hypothyroidism, unspecified: Secondary | ICD-10-CM | POA: Diagnosis not present

## 2023-12-12 DIAGNOSIS — Z1212 Encounter for screening for malignant neoplasm of rectum: Secondary | ICD-10-CM | POA: Diagnosis not present

## 2023-12-13 DIAGNOSIS — G609 Hereditary and idiopathic neuropathy, unspecified: Secondary | ICD-10-CM | POA: Diagnosis not present

## 2023-12-13 DIAGNOSIS — E119 Type 2 diabetes mellitus without complications: Secondary | ICD-10-CM | POA: Diagnosis not present

## 2023-12-13 DIAGNOSIS — Z0000 Encounter for general adult medical examination without abnormal findings: Secondary | ICD-10-CM | POA: Diagnosis not present

## 2023-12-13 DIAGNOSIS — R82998 Other abnormal findings in urine: Secondary | ICD-10-CM | POA: Diagnosis not present

## 2023-12-13 DIAGNOSIS — Z1331 Encounter for screening for depression: Secondary | ICD-10-CM | POA: Diagnosis not present

## 2023-12-13 DIAGNOSIS — E040 Nontoxic diffuse goiter: Secondary | ICD-10-CM | POA: Diagnosis not present

## 2023-12-13 DIAGNOSIS — Z1339 Encounter for screening examination for other mental health and behavioral disorders: Secondary | ICD-10-CM | POA: Diagnosis not present

## 2023-12-13 DIAGNOSIS — Z23 Encounter for immunization: Secondary | ICD-10-CM | POA: Diagnosis not present

## 2023-12-13 DIAGNOSIS — M1990 Unspecified osteoarthritis, unspecified site: Secondary | ICD-10-CM | POA: Diagnosis not present

## 2023-12-13 DIAGNOSIS — M8580 Other specified disorders of bone density and structure, unspecified site: Secondary | ICD-10-CM | POA: Diagnosis not present

## 2023-12-13 DIAGNOSIS — E785 Hyperlipidemia, unspecified: Secondary | ICD-10-CM | POA: Diagnosis not present

## 2023-12-13 DIAGNOSIS — I10 Essential (primary) hypertension: Secondary | ICD-10-CM | POA: Diagnosis not present

## 2023-12-30 DIAGNOSIS — Z01419 Encounter for gynecological examination (general) (routine) without abnormal findings: Secondary | ICD-10-CM | POA: Diagnosis not present

## 2024-01-22 DIAGNOSIS — I70203 Unspecified atherosclerosis of native arteries of extremities, bilateral legs: Secondary | ICD-10-CM | POA: Diagnosis not present

## 2024-01-22 DIAGNOSIS — M205X1 Other deformities of toe(s) (acquired), right foot: Secondary | ICD-10-CM | POA: Diagnosis not present

## 2024-01-22 DIAGNOSIS — M792 Neuralgia and neuritis, unspecified: Secondary | ICD-10-CM | POA: Diagnosis not present

## 2024-01-22 DIAGNOSIS — B351 Tinea unguium: Secondary | ICD-10-CM | POA: Diagnosis not present

## 2024-01-22 DIAGNOSIS — L84 Corns and callosities: Secondary | ICD-10-CM | POA: Diagnosis not present

## 2024-01-22 DIAGNOSIS — E139 Other specified diabetes mellitus without complications: Secondary | ICD-10-CM | POA: Diagnosis not present

## 2024-01-22 DIAGNOSIS — M205X2 Other deformities of toe(s) (acquired), left foot: Secondary | ICD-10-CM | POA: Diagnosis not present

## 2024-06-11 DIAGNOSIS — J309 Allergic rhinitis, unspecified: Secondary | ICD-10-CM | POA: Diagnosis not present

## 2024-06-11 DIAGNOSIS — R946 Abnormal results of thyroid function studies: Secondary | ICD-10-CM | POA: Diagnosis not present

## 2024-06-11 DIAGNOSIS — E663 Overweight: Secondary | ICD-10-CM | POA: Diagnosis not present

## 2024-06-11 DIAGNOSIS — I10 Essential (primary) hypertension: Secondary | ICD-10-CM | POA: Diagnosis not present

## 2024-06-11 DIAGNOSIS — E785 Hyperlipidemia, unspecified: Secondary | ICD-10-CM | POA: Diagnosis not present

## 2024-06-11 DIAGNOSIS — E119 Type 2 diabetes mellitus without complications: Secondary | ICD-10-CM | POA: Diagnosis not present

## 2024-06-11 DIAGNOSIS — M1990 Unspecified osteoarthritis, unspecified site: Secondary | ICD-10-CM | POA: Diagnosis not present

## 2024-06-11 DIAGNOSIS — G5601 Carpal tunnel syndrome, right upper limb: Secondary | ICD-10-CM | POA: Diagnosis not present

## 2024-06-11 DIAGNOSIS — M8580 Other specified disorders of bone density and structure, unspecified site: Secondary | ICD-10-CM | POA: Diagnosis not present

## 2024-06-11 DIAGNOSIS — G609 Hereditary and idiopathic neuropathy, unspecified: Secondary | ICD-10-CM | POA: Diagnosis not present

## 2024-06-19 DIAGNOSIS — Z1231 Encounter for screening mammogram for malignant neoplasm of breast: Secondary | ICD-10-CM | POA: Diagnosis not present

## 2024-09-03 DIAGNOSIS — M205X2 Other deformities of toe(s) (acquired), left foot: Secondary | ICD-10-CM | POA: Diagnosis not present

## 2024-09-03 DIAGNOSIS — B351 Tinea unguium: Secondary | ICD-10-CM | POA: Diagnosis not present

## 2024-09-03 DIAGNOSIS — M205X1 Other deformities of toe(s) (acquired), right foot: Secondary | ICD-10-CM | POA: Diagnosis not present

## 2024-09-03 DIAGNOSIS — L84 Corns and callosities: Secondary | ICD-10-CM | POA: Diagnosis not present

## 2024-09-03 DIAGNOSIS — E139 Other specified diabetes mellitus without complications: Secondary | ICD-10-CM | POA: Diagnosis not present

## 2024-09-03 DIAGNOSIS — M7661 Achilles tendinitis, right leg: Secondary | ICD-10-CM | POA: Diagnosis not present

## 2024-09-21 DIAGNOSIS — M7661 Achilles tendinitis, right leg: Secondary | ICD-10-CM | POA: Diagnosis not present

## 2024-09-21 DIAGNOSIS — E139 Other specified diabetes mellitus without complications: Secondary | ICD-10-CM | POA: Diagnosis not present

## 2024-09-21 DIAGNOSIS — M205X1 Other deformities of toe(s) (acquired), right foot: Secondary | ICD-10-CM | POA: Diagnosis not present

## 2024-09-21 DIAGNOSIS — L84 Corns and callosities: Secondary | ICD-10-CM | POA: Diagnosis not present

## 2024-09-21 DIAGNOSIS — B351 Tinea unguium: Secondary | ICD-10-CM | POA: Diagnosis not present

## 2024-09-21 DIAGNOSIS — M205X2 Other deformities of toe(s) (acquired), left foot: Secondary | ICD-10-CM | POA: Diagnosis not present

## 2024-10-15 ENCOUNTER — Other Ambulatory Visit: Payer: Self-pay

## 2024-11-13 NOTE — Progress Notes (Signed)
 [name] [name]                                          MRN: [id number]   11/13/2024   The [facility] Quality Team Specialist reviewed this patient medical record for the purposes of chart review for care gap closure. The following were reviewed: chart review for care gap closure-glycemic status assessment.    [facility] Quality Team
